# Patient Record
Sex: Female | Born: 1995 | Race: White | Hispanic: No | Marital: Single | Smoking: Never smoker
Health system: Southern US, Community
[De-identification: ages and names within clinical notes are randomized; demographics above are authoritative.]

## PROBLEM LIST (undated history)

## (undated) DIAGNOSIS — M358 Other specified systemic involvement of connective tissue: Secondary | ICD-10-CM

## (undated) DIAGNOSIS — J45909 Unspecified asthma, uncomplicated: Secondary | ICD-10-CM

## (undated) DIAGNOSIS — I7300 Raynaud's syndrome without gangrene: Secondary | ICD-10-CM

## (undated) DIAGNOSIS — K743 Primary biliary cirrhosis: Secondary | ICD-10-CM

## (undated) DIAGNOSIS — L940 Localized scleroderma [morphea]: Secondary | ICD-10-CM

## (undated) HISTORY — DX: Primary biliary cirrhosis: K74.3

## (undated) HISTORY — PX: HIP SURGERY: SHX245

## (undated) HISTORY — DX: Other specified systemic involvement of connective tissue: M35.8

## (undated) HISTORY — DX: Localized scleroderma (morphea): L94.0

---
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
## 2016-02-16 ENCOUNTER — Ambulatory Visit (INDEPENDENT_AMBULATORY_CARE_PROVIDER_SITE_OTHER): Payer: 59 | Admitting: Family Medicine

## 2016-02-16 ENCOUNTER — Encounter: Payer: Self-pay | Admitting: Family Medicine

## 2016-02-16 DIAGNOSIS — M76891 Other specified enthesopathies of right lower limb, excluding foot: Secondary | ICD-10-CM | POA: Diagnosis not present

## 2016-02-16 DIAGNOSIS — M25851 Other specified joint disorders, right hip: Secondary | ICD-10-CM

## 2016-02-16 NOTE — Progress Notes (Signed)
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 Patient states that over the summer she went to go see a hip surgeon. Since the patient had not done well with conservative nonoperative treatment during the spring she decided to have surgery. According to the op notes the labrum was intact and the patient had acetabuloplasty. She has a hip brace which she is supposed to wear for the next few weeks. She also has been told to see physical therapy twice weekly. She is having her first appointment with the physical therapist tomorrow. She denies having any significant pain at this time. She has another appointment with her surgeon in October. She denies any other symptoms at this time.  No exam performed today.  A/P: Right hip FAI, s/p acetabuloplasty - will write order for patient to see physical therapist twice weekly and follow protocol set by surgeon and PT at home, patient will also see Dr. Ardine Engiehl next week to make him aware of her case, advised patient to inform trainer if any increase in pain or other symptoms. Patient addresses understanding and is happy with plan.

## 2016-02-16 NOTE — Addendum Note (Signed)
Addended by: Dione HousekeeperPATEL, Janiyah Beery N on: 02/16/2016 11:13 AM   Modules accepted: Orders

## 2016-02-29 ENCOUNTER — Ambulatory Visit: Payer: 59 | Attending: Family Medicine | Admitting: Physical Therapy

## 2016-02-29 DIAGNOSIS — M25552 Pain in left hip: Secondary | ICD-10-CM | POA: Diagnosis present

## 2016-02-29 DIAGNOSIS — Z9889 Other specified postprocedural states: Secondary | ICD-10-CM | POA: Insufficient documentation

## 2016-02-29 DIAGNOSIS — R262 Difficulty in walking, not elsewhere classified: Secondary | ICD-10-CM | POA: Insufficient documentation

## 2016-02-29 DIAGNOSIS — M25551 Pain in right hip: Secondary | ICD-10-CM | POA: Insufficient documentation

## 2016-02-29 NOTE — Therapy (Signed)
[facility] PHYSICAL AND SPORTS MEDICINE [address] [place], [place], [address] Phone: [phone]   Fax:  [phone]  Physical Therapy Evaluation  Patient Details  Name: Meredith English MRN: [id number] Date of Birth: 1996-01-10 No Data Recorded  Encounter Date: 02/29/2016      PT End of Session - 02/29/16 1645    Visit Number 1   Number of Visits 13   Date for PT Re-Evaluation 05/23/16   PT Start Time 1345   PT Stop Time 1445   PT Time Calculation (min) 60 min   Activity Tolerance Patient tolerated treatment well   Behavior [facility] Therapy [facility] for tasks assessed/performed      No past medical history on file.  No past surgical history on file.  There were no vitals filed for this visit.       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 She was initially treated for a hip flexor strain. X-ray in March which was negative., MRI prior to returning home in April which showed nothing substantial. She tried PT and had an MRI arthrogram, was recommended PT vs cortisone vs sx. Opted for PT, though did not make substantial progress and opted for surgery.    Pertinent History FAI sx for R hip [place] January 05, 2016 in [place], began PT a week after sx and has been working with Su HoffBen Fisher x 1 per week at [employer] [facility].    Limitations Walking;Lifting;Sitting;Standing   How long can you sit comfortably? Patient reports anything longer than 45 minutes bothers it.    How long can you stand comfortably? Patient reports anything longer than 45 minutes bothers it.    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    Currently in Pain? No/denies  Only with pure hip  flexion motion.             [facility] PT Assessment - 02/29/16 1756      Assessment   Medical Diagnosis --  FAI surgery     Precautions   Precautions Anterior Hip   Precaution Comments --  See Surgeon's protocols   Required Braces or Orthoses --  Removed today     Restrictions   Weight Bearing Restrictions --  WBAT     Balance Screen   Has the patient fallen in the past 6 months No     Prior Function   Level of Independence Independent   Vocation Student   Leisure --  XC and Engineer, building servicesTrack athlete at [employer]     Cognition   Overall Cognitive Status Within Functional Limits for tasks assessed     Observation/Other Assessments-Edema    Edema --  None noted     Sensation   Light Touch Appears Intact       IR/ER on the R side painful (hip MMT) also on PROM strength mild deficit, but likely pain inhibited   No pain with knee extensor/flexor MMT -- all noted to be strong.   L knee to chest stretching by PT, felt stretch in anterior R thigh (felt good) -- felt relief with ambulation and increased ROM afterwards.   L hip  flexion stretching (IR/ER ROM WNL)   Sidelying clamshells with red t-band x 10 for 2 sets on R side only.    Assessed mini-squats , shifted weight to R otherwise appropriate technique.   No pain ascending/descending steps aside from hip flexion motion in anterior groin/thigh area though mild.   Single leg bridging on RLE (bilateral too easy) 2 sets x 5 repetitions with 5" holds (felt appropriately)   Gait- noted decreased DF on RLE.                      PT Education - 02/29/16 1749    Education provided Yes   Education Details Progression with therapy, avoid aggressive stretching into hip extension, protocol on her surgeon's website.    Person(s) Educated Patient   Methods Explanation;Demonstration;Handout   Comprehension Verbalized understanding;Returned demonstration             PT Long Term Goals - 02/29/16 1752      PT LONG  TERM GOAL #1   Title Patient will jog at least 1 mile with no increase in pain to demonstrate ability to tolerate athletic activities.      PT LONG TERM GOAL #2   Title Patient will perform full depth squat with 20# of external load with no increase in pain to demonstrate ability to tolerate recreational activities.    Time 6   Period Weeks   Status New     PT LONG TERM GOAL #3   Title Patient will perform single leg box jumps to at least 10" box with no valgus or pain noted to demonstrate appropriate power and strength of hip abductors for return to sport.    Period Weeks   Status New     PT LONG TERM GOAL #4   Title Patient will demonstrate at least 90% distance on triple hop, crossover test on RLE compared to LLE to demonstrate ability to return to sport.    Time 12   Period Weeks               Plan - 02/29/16 1645    Clinical Impression Statement Patient is roughly 8 weeks post-op FAI surgery on R hip after traumatic hip extension during a hurdles event. 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 She demonstrates Trendelenburg gait currently and reports pain with active hip flexion and passive hip extension, largely in her groin. She has been excellent with HEP and maintains active training in pool. Patient will benefit from skilled therapy services to address her deficits in strength, power, and ROM to address the deficits identified in this evaluation.    Rehab Potential Excellent   Clinical Impairments Affecting Rehab Potential Athletic background, rehab starting just after therapy    PT Frequency 1x / week   PT Duration 12 weeks   PT Treatment/Interventions ADLs/Self Care Home Management;Aquatic Therapy;Cryotherapy;Chief Operating Officer;Therapeutic exercise;Therapeutic activities;Manual techniques;Gait training;Stair training;Patient/family education   PT Next Visit Plan Progress through  protocol provided as tolerated    PT Home Exercise Plan Continue with her current HEP adding single leg bridging, sidelying clamshells, limiting hip flexor stretching.    Consulted and Agree with Plan of Care Patient      Patient will benefit from skilled therapeutic intervention in order to improve the following deficits and impairments:  Abnormal gait, Decreased endurance, Pain, Decreased strength, Difficulty walking, Decreased range of motion, Decreased mobility  Visit Diagnosis: Pain in  left hip  Difficulty in walking, not elsewhere classified  Status post hip surgery     Problem List There are no active problems to display for this patient.  Kerin Ransom, PT, DPT    02/29/2016, 6:05 PM   [facility] PHYSICAL AND SPORTS MEDICINE [address] [place], [address] Phone: [phone]   Fax:  [phone]  Name: Meredith English MRN: [id number] Date of Birth: Aug 13, 1995

## 2016-02-29 NOTE — Patient Instructions (Addendum)
IR/ER on the R side painful (hip MMT) also on PROM strength mild deficit, but likely pain inhibited   No pain with knee extensor/flexor MMT -- all noted to be strong.   L knee to chest stretching by PT, felt stretch in anterior R thigh (felt good) -- felt relief with ambulation and increased ROM afterwards.   L hip flexion stretching (IR/ER ROM WNL)   Sidelying clamshells with red t-band x 10 for 2 sets on R side only.    Assessed mini-squats , shifted weight to R otherwise appropriate technique.   No pain ascending/descending steps aside from hip flexion motion in anterior groin/thigh area though mild.   Single leg bridging on RLE (bilateral too easy) 2 sets x 5 repetitions with 5" holds (felt appropriately)   Gait- noted decreased DF on RLE.

## 2016-03-07 ENCOUNTER — Ambulatory Visit: Payer: 59 | Admitting: Physical Therapy

## 2016-03-07 DIAGNOSIS — M25552 Pain in left hip: Secondary | ICD-10-CM | POA: Diagnosis not present

## 2016-03-07 DIAGNOSIS — R262 Difficulty in walking, not elsewhere classified: Secondary | ICD-10-CM

## 2016-03-07 DIAGNOSIS — M25551 Pain in right hip: Secondary | ICD-10-CM

## 2016-03-07 DIAGNOSIS — Z9889 Other specified postprocedural states: Secondary | ICD-10-CM

## 2016-03-07 NOTE — Patient Instructions (Addendum)
Planks x 5 for 15"  Side planks x 5 for 15" holds (5# weight when LLE down)  Single leg bridging on RLE   Leg Press at 55# for 12, 65# for 12, x10 repetitions   Squat --   Gait assessed --   Standing hip abductions x 15 repetition for 2 sets (felt fatigued afterwards)    Bosu and with squats   Star Drill on Blue foam   (half kneeling single arm/double arm press on blue foam pad)

## 2016-03-08 NOTE — Therapy (Signed)
[place] [facility]ORTS MEDICINE [address] [place], [place], [address] Phone: [phone]   Fax:  [phone]  Physical Therapy Treatment  Patient Details  Name: Meredith English MRN: [id number] Date of Birth: 1995-07-17 No Data Recorded  Encounter Date: 03/07/2016      PT End of Session - 03/07/16 1837    Visit Number 2   Number of Visits 25   Date for PT Re-Evaluation 05/23/16   PT Start Time 1750   PT Stop Time 1835   PT Time Calculation (min) 45 min   Activity Tolerance Patient tolerated treatment well   Behavior During Therapy [facility] for tasks assessed/performed      No past medical history on file.  No past surgical history on file.  There were no vitals filed for this visit.      Subjective Assessment - 03/07/16 1751    Subjective Patient reports she will be transitioning to 2x per week here at [facility]. Patient reporting no pain recently, weak but [facility] with no pain, reporting less pain with hip flexion activity.    Pertinent History FAI sx for R hip on January 05, 2016 in [place], began PT a week after sx and has been working with Su HoffBen Fisher x 1 per week at [employer].    Limitations Walking;Lifting;Sitting;Standing   How long can you sit comfortably? Patient reports anything longer than 45 minutes bothers it.    How long can you stand comfortably? Patient reports anything longer than 45 minutes bothers it.    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    Currently in Pain? No/denies      Planks x 5 for 15" (challenging, required cuing for appropriate alignment of lumbo-pelvic region)   Side planks x 5 for 15" holds (5# weight when LLE down -- for rotations) -- challenging but no pain reported.   Single leg bridging on RLE able to hold and fatigue in posterior hip  musculature appropriately.   Leg Press at 55# for 12, 65# for 12, x10 repetitions   Squat -- able to get to roughly 50-60 degrees knee flexion secondary to pain/pinching sensation in anterior thigh.   Gait assessed -- continue to note mild to moderate IR of RLE during gait relative to LLE  Standing hip abductions x 15 repetition for 2 sets (felt fatigued afterwards)   Bosu squats with catching progressed to lateral step downs (appeared too challenging thus dc'd after 5 repetitions).   Star Drill on Blue foam x 5 rounds x 2 (second round with dual task of catching a ball)  (half kneeling single arm/double arm press on blue foam pad) x 5 per condition with 5#, 6# DBs for first then second sets bilaterally (no pain reported).                             PT Education - 03/07/16 1835    Education provided Yes   Education Details Will progress with more challenging exercises including deeper ROM squats, planks, side planks.    Person(s) Educated Patient   Methods Explanation;Demonstration   Comprehension Verbalized understanding;Returned demonstration             PT Long Term Goals - 02/29/16 1752  PT LONG TERM GOAL #1   Title Patient will jog at least 1 mile with no increase in pain to demonstrate ability to tolerate athletic activities.      PT LONG TERM GOAL #2   Title Patient will perform full depth squat with 20# of external load with no increase in pain to demonstrate ability to tolerate recreational activities.    Time 6   Period Weeks   Status New     PT LONG TERM GOAL #3   Title Patient will perform single leg box jumps to at least 10" box with no valgus or pain noted to demonstrate appropriate power and strength of hip abductors for return to sport.    Period Weeks   Status New     PT LONG TERM GOAL #4   Title Patient will demonstrate at least 90% distance on triple hop, crossover test on RLE compared to LLE to demonstrate ability to  return to sport.    Time 12   Period Weeks               Plan - 03/07/16 1838    Clinical Impression Statement Patient continues to demonstrate IR during gait on RLE, consistent with hip abductor/ER weakness, she fatigued quickly throughout session with more advanced exercise progressions. She reported mild discomfort at the conclusion of the session, educated to stretch and ice this evening and monitor symptoms over 24 hours. She is progressing well and demonstrating appropriate progress with therapy to date, though she will require additional skilled PT services to return to sporting activities.    Rehab Potential Excellent   Clinical Impairments Affecting Rehab Potential Athletic background, rehab starting just after therapy    PT Frequency 2x / week   PT Duration 12 weeks   PT Treatment/Interventions ADLs/Self Care Home Management;Aquatic Therapy;Cryotherapy;Chief Operating Officer;Therapeutic exercise;Therapeutic activities;Manual techniques;Gait training;Stair training;Patient/family education   PT Next Visit Plan Progress through protocol provided as tolerated    PT Home Exercise Plan Continue with her current HEP adding single leg bridging, sidelying clamshells, limiting hip flexor stretching.    Consulted and Agree with Plan of Care Patient      Patient will benefit from skilled therapeutic intervention in order to improve the following deficits and impairments:  Abnormal gait, Decreased endurance, Pain, Decreased strength, Difficulty walking, Decreased range of motion, Decreased mobility  Visit Diagnosis: Difficulty in walking, not elsewhere classified  Status post hip surgery  Pain in right hip     Problem List There are no active problems to display for this patient.  Kerin Ransom, PT, DPT    03/08/2016, 10:43 AM  [place] [facility] PHYSICAL AND SPORTS MEDICINE [address], [place],  [address] Phone: [phone]   Fax:  [phone]  Name: Cheresa Siers MRN: [id number] Date of Birth: January 01, 1996

## 2016-03-10 ENCOUNTER — Ambulatory Visit: Payer: 59 | Admitting: Physical Therapy

## 2016-03-10 DIAGNOSIS — M25552 Pain in left hip: Secondary | ICD-10-CM | POA: Diagnosis not present

## 2016-03-10 DIAGNOSIS — M25551 Pain in right hip: Secondary | ICD-10-CM

## 2016-03-10 DIAGNOSIS — Z9889 Other specified postprocedural states: Secondary | ICD-10-CM

## 2016-03-10 DIAGNOSIS — R262 Difficulty in walking, not elsewhere classified: Secondary | ICD-10-CM

## 2016-03-10 NOTE — Patient Instructions (Addendum)
Standing hip abductions x 12 per side for 2 sets (tiring)   Palloff  Press with 10#  TG Squats to 90 degrees at level 18 x 12, at level 22 x 12 repetitions (no pinching) -- at level 24 x 10 (no pinching) -- Single leg x 12 at level 22 (2 sets)   Stir the pot on the grey ball   Lateral side to side

## 2016-03-14 NOTE — Therapy (Signed)
[facility] PH[facility]ORTS MEDICINE [address] [place], [address] Phone: [phone]   Fax:  [phone]  Physical Therapy Treatment  Patient Details  Name: Meredith English MRN: [id number] Date of Birth: 1996-05-18 No Data Recorded  Encounter Date: 03/10/2016      PT End of Session - 03/14/16 0934    Visit Number 3   Number of Visits 25   Date for PT Re-Evaluation 05/23/16   PT Start Time 1650   PT Stop Time 1730   PT Time Calculation (min) 40 min   Activity Tolerance Patient tolerated treatment well   Behavior During Therapy [facility] for tasks assessed/performed      No past medical history on file.  No past surgical history on file.  There were no vitals filed for this visit.      Subjective Assessment - 03/14/16 0937    Subjective Patient reports she felt some soreness after her last session, which dissipated after stretching and by this session.    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    Limitations Walking;Lifting;Sitting;Standing   How long can you sit comfortably? Patient reports anything longer than 45 minutes bothers it.    How long can you stand comfortably? Patient reports anything longer than 45 minutes bothers it.    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    Currently in Pain? No/denies      Standing hip abductions x 12 per side for 2 sets (tiring)   Palloff  Press with 10# (2 sets x 10 repetitions bilaterally)   TG Squats to 90 degrees at level 18 x 12, at level 22 x 12 repetitions (no pinching) -- at level 24 x 10 (no pinching) -- Single leg x 12 at level 22 (2 sets)   Stir the pot on the grey ball 2 sets x 8 repetitions per side    Lateral side to side resisted band walking x 8  bilaterally with yellow t-band x 2 sets   ** Patient reported fatigue, not pain after this. Performed half kneeling hip flexor stretch for pain control.                             PT Education - 03/14/16 0933    Education provided Yes   Education Details Organized HEP to reduce demand of time.    Person(s) Educated Patient   Methods Explanation;Demonstration   Comprehension Verbalized understanding;Returned demonstration             PT Long Term Goals - 02/29/16 1752      PT LONG TERM GOAL #1   Title Patient will jog at least 1 mile with no increase in pain to demonstrate ability to tolerate athletic activities.      PT LONG TERM GOAL #2   Title Patient will perform full depth squat with 20# of external load with no increase in pain to demonstrate ability to tolerate recreational activities.    Time 6   Period Weeks   Status New     PT LONG TERM GOAL #3   Title Patient will perform single leg box jumps to  at least 10" box with no valgus or pain noted to demonstrate appropriate power and strength of hip abductors for return to sport.    Period Weeks   Status New     PT LONG TERM GOAL #4   Title Patient will demonstrate at least 90% distance on triple hop, crossover test on RLE compared to LLE to demonstrate ability to return to sport.    Time 12   Period Weeks               Plan - 03/14/16 0935    Clinical Impression Statement Patient demonstrating improved tolerance for strengthening activities, though she fatigues throughout session. She can tolerate increased core progressions, though fatigues with repetitions. She is reporting less pain in her anterior hip, denies any pain with squatting on total gym in this session. Patient will continue to benefit from skilled PT services to address her decreased strength and endurance.    Rehab Potential Excellent   Clinical Impairments Affecting Rehab Potential Athletic background, rehab starting just  after therapy    PT Frequency 2x / week   PT Duration 12 weeks   PT Treatment/Interventions ADLs/Self Care Home Management;Aquatic Therapy;Cryotherapy;Chief Operating Officerlectrical Stimulation;Traction;Balance training;Therapeutic exercise;Therapeutic activities;Manual techniques;Gait training;Stair training;Patient/family education   PT Next Visit Plan Progress through protocol provided as tolerated    PT Home Exercise Plan Continue with her current HEP adding single leg bridging, sidelying clamshells, limiting hip flexor stretching.    Consulted and Agree with Plan of Care Patient      Patient will benefit from skilled therapeutic intervention in order to improve the following deficits and impairments:  Abnormal gait, Decreased endurance, Pain, Decreased strength, Difficulty walking, Decreased range of motion, Decreased mobility  Visit Diagnosis: Difficulty in walking, not elsewhere classified  Status post hip surgery  Pain in right hip     Problem List There are no active problems to display for this patient.  Kerin RansomPatrick A McNamara, PT, DPT    03/14/2016, 9:47 AM  [facility]ICINE [address] [place], [place], [address] Phone: [phone]   Fax:  [phone]  Name: Meredith English MRN: [id number] Date of Birth: May 18, 1996

## 2016-03-15 ENCOUNTER — Ambulatory Visit: Payer: 59 | Admitting: Physical Therapy

## 2016-03-15 DIAGNOSIS — M25552 Pain in left hip: Secondary | ICD-10-CM | POA: Diagnosis not present

## 2016-03-15 DIAGNOSIS — R262 Difficulty in walking, not elsewhere classified: Secondary | ICD-10-CM

## 2016-03-15 DIAGNOSIS — M25551 Pain in right hip: Secondary | ICD-10-CM

## 2016-03-15 DIAGNOSIS — Z9889 Other specified postprocedural states: Secondary | ICD-10-CM

## 2016-03-15 NOTE — Therapy (Signed)
[place] [facility] PHYSICAL AND SPORTS MEDICINE [address] [place], [place], [address] Phone: [phone]   Fax:  [phone]  Physical Therapy Treatment  Patient Details  Name: Meredith English MRN: [id number] Date of Birth: 03/20/1996 No Data Recorded  Encounter Date: 03/15/2016      PT End of Session - 03/15/16 0933    Visit Number 4   Number of Visits 25   Date for PT Re-Evaluation 05/23/16   PT Start Time 0845   PT Stop Time 0923   PT Time Calculation (min) 38 min   Activity Tolerance Patient tolerated treatment well   Behavior [facility] assessed/performed      No past medical history on file.  No past surgical history on file.  There were no vitals filed for this visit.      Subjective Assessment - 03/15/16 0849    Subjective Patient reports she was sore after last PT session, but has felt great all weekend (didn't feel like she needed to ice).    Pertinent History FAI sx for R hip on January 05, 2016 in [place], began PT a week after sx and has been working with Su HoffBen Fisher x 1 per week at [employer].    Limitations Walking;Lifting;Sitting;Standing   How long can you sit comfortably? Patient reports anything longer than 45 minutes bothers it.    How long can you stand comfortably? Patient reports anything longer than 45 minutes bothers it.    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    Currently in Pain? No/denies      1/2 kneeling hip flexor stretch 2 per side  Side stepping with red t-band x 18 per side for 2 sets (fatiguing for her in posterior hip)   Lunges x 20, x 8 per side with 3# DB in waiter's position (challenging but no pain) -- progressed to suitcase posiiton with 5# DB x 10 per side (no pain)   Leg Press -- 75# x 8 repetitions,  85# x 8 repetitions for 2 sets  Planks with 1 arm up to  contralateral arm to LEs 1 at a time x 10 seconds for 60" total. (most challenging)   5" holds on plank with LLE elevated x 5 repetitions with breaks in between.                            PT Education - 03/15/16 0932    Education provided Yes   Education Details Added squats with band and plank with leg lift to HEP.    Person(s) Educated Patient   Methods Explanation;Demonstration;Handout   Comprehension Verbalized understanding;Returned demonstration             PT Long Term Goals - 02/29/16 1752      PT LONG TERM GOAL #1   Title Patient will jog at least 1 mile with no increase in pain to demonstrate ability to tolerate athletic activities.      PT LONG TERM GOAL #2   Title Patient will perform full depth squat with 20# of external load with no increase in pain to demonstrate ability to tolerate recreational activities.    Time 6   Period Weeks   Status New     PT LONG TERM GOAL #3   Title Patient  will perform single leg box jumps to at least 10" box with no valgus or pain noted to demonstrate appropriate power and strength of hip abductors for return to sport.    Period Weeks   Status New     PT LONG TERM GOAL #4   Title Patient will demonstrate at least 90% distance on triple hop, crossover test on RLE compared to LLE to demonstrate ability to return to sport.    Time 12   Period Weeks               Plan - 03/15/16 6045    Clinical Impression Statement Patient reports significant improvement in symptoms over the weekend with no pain or need for icing. She continues to demonstrate weakness/fatigue with prolonged resistance training/core training. She struggles with LLE elevation during planks and shifts weight to the R during squats, demonstrates improved glute med strength via side stepping.    Rehab Potential Excellent   Clinical Impairments Affecting Rehab Potential Athletic background, rehab starting just after therapy    PT Frequency  2x / week   PT Duration 12 weeks   PT Treatment/Interventions ADLs/Self Care Home Management;Aquatic Therapy;Cryotherapy;Chief Operating Officer;Therapeutic exercise;Therapeutic activities;Manual techniques;Gait training;Stair training;Patient/family education   PT Next Visit Plan Progress through protocol provided as tolerated    PT Home Exercise Plan Continue with her current HEP adding single leg bridging, sidelying clamshells, limiting hip flexor stretching.    Consulted and Agree with Plan of Care Patient      Patient will benefit from skilled therapeutic intervention in order to improve the following deficits and impairments:  Abnormal gait, Decreased endurance, Pain, Decreased strength, Difficulty walking, Decreased range of motion, Decreased mobility  Visit Diagnosis: Difficulty in walking, not elsewhere classified  Status post hip surgery  Pain in right hip     Problem List There are no active problems to display for this patient.  Kerin Ransom, PT, DPT    03/15/2016, 9:38 AM  [place] [facility] PHYSICAL AND SPORTS MEDICINE [address], [place], [address] Phone: [phone]   Fax:  [phone]  Name: Meredith English MRN: [id number] Date of Birth: Feb 05, 1996

## 2016-03-15 NOTE — Patient Instructions (Addendum)
1/2 kneeling hip flexor stretch 2 per side  Side stepping with red t-band x 18 per side for 2 sets (fatiguing for her in posterior hip)   Lunges x 20, x 8 per side with 3# DB in waiter's position (challenging but no pain) -- progressed to suitcase posiiton with 5# DB x 10 per side (no pain)   Leg Press -- 75# x 8 repetitions,  85# x 8 repetitions for 2 sets  Planks with 1 arm up to contralateral arm to LEs 1 at a time x 10 seconds for 60" total. (most challenging)   5" holds on plank with LLE elevated x 5 repetitions with breaks in between.

## 2016-03-17 ENCOUNTER — Ambulatory Visit: Payer: 59 | Admitting: Physical Therapy

## 2016-03-17 DIAGNOSIS — M25552 Pain in left hip: Secondary | ICD-10-CM | POA: Diagnosis not present

## 2016-03-17 DIAGNOSIS — M25551 Pain in right hip: Secondary | ICD-10-CM

## 2016-03-17 DIAGNOSIS — Z9889 Other specified postprocedural states: Secondary | ICD-10-CM

## 2016-03-17 DIAGNOSIS — R262 Difficulty in walking, not elsewhere classified: Secondary | ICD-10-CM

## 2016-03-17 NOTE — Therapy (Signed)
[place] [facility] PH[facility]ORTS MEDICINE [address] [place], [address] Phone: [phone]   Fax:  [phone]  Physical Therapy Treatment  Patient Details  Name: Meredith English MRN: [id number] Date of Birth: 18-May-1996 No Data Recorded  Encounter Date: 03/17/2016      PT End of Session - 03/17/16 1019    Visit Number 5   Number of Visits 25   Date for PT Re-Evaluation 05/23/16   PT Start Time 0932   PT Stop Time 1015   PT Time Calculation (min) 43 min   Activity Tolerance Patient tolerated treatment well   Behavior During Therapy [facility] for tasks assessed/performed      No past medical history on file.  No past surgical history on file.  There were no vitals filed for this visit.      Subjective Assessment - 03/17/16 0934    Subjective Patient reports she saw Dr. Sherrie Mustache at [facility] yesterday, no progressions made as she was coming to therapy today as well. Reports feeling pretty good today.    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    Limitations Walking;Lifting;Sitting;Standing   How long can you sit comfortably? Patient reports anything longer than 45 minutes bothers it.    How long can you stand comfortably? Patient reports anything longer than 45 minutes bothers it.    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    Currently in Pain? No/denies     Bil hip flexor stretch, 2x30 sec each  Bil SLS on airex with ball toss, on bosu with external perturbations with PVC pipe, on airex with EC and external perturbations with PVC pipe x 15 mins total  Bil fwd lunging with FFE on BOSU, 2x15 each; increased difficulty with RLE fwd  Lunge with alternating OH suitcase carry with 7# DB, 5ft x 5 laps; pt reported as  challenging  Bil SLS on airex and lifts with 10# x 10 each, 15# x 10 each  bil SLS on BOSU with ball toss into trampoline, 3x10 each; increased difficulty on LLE  bil trunk rotations with 2 grey bands (bil stance) x 15 each side; progressed to bil half kneeling with trunk rotations with 2 grey bands x 15 each; pt felt slight increased pain in R hip when performing with LLE down on airex pad which subsided after exercise      PT Education - 03/17/16 1018    Education provided Yes   Education Details continue HEP and stretching, will update HEP next week   Person(s) Educated Patient   Methods Explanation   Comprehension Verbalized understanding             PT Long Term Goals - 02/29/16 1752      PT LONG TERM GOAL #1   Title Patient will jog at least 1 mile with no increase in pain to demonstrate ability to tolerate athletic activities.      PT LONG TERM GOAL #2   Title Patient will perform full depth squat with 20# of external load with no increase in pain to demonstrate ability to tolerate recreational activities.    Time 6   Period Weeks   Status New  PT LONG TERM GOAL #3   Title Patient will perform single leg box jumps to at least 10" box with no valgus or pain noted to demonstrate appropriate power and strength of hip abductors for return to sport.    Period Weeks   Status New     PT LONG TERM GOAL #4   Title Patient will demonstrate at least 90% distance on triple hop, crossover test on RLE compared to LLE to demonstrate ability to return to sport.    Time 12   Period Weeks               Plan - 03/17/16 1019    Clinical Impression Statement Pt tolerated core strengthening and proprioception training well this date, with no increases in R hip pain. Pt had increased difficulty with balance and proprioception training on LLE, but did c/o RLE fatigue at end of session this date. Pt educated to continue HEP.   Rehab Potential Excellent   Clinical Impairments  Affecting Rehab Potential Athletic background, rehab starting just after therapy    PT Frequency 2x / week   PT Duration 12 weeks   PT Treatment/Interventions ADLs/Self Care Home Management;Aquatic Therapy;Cryotherapy;Chief Operating Officerlectrical Stimulation;Traction;Balance training;Therapeutic exercise;Therapeutic activities;Manual techniques;Gait training;Stair training;Patient/family education   PT Next Visit Plan Progress through protocol provided as tolerated    PT Home Exercise Plan Continue with her current HEP adding single leg bridging, sidelying clamshells, limiting hip flexor stretching.    Consulted and Agree with Plan of Care Patient      Patient will benefit from skilled therapeutic intervention in order to improve the following deficits and impairments:  Abnormal gait, Decreased endurance, Pain, Decreased strength, Difficulty walking, Decreased range of motion, Decreased mobility  Visit Diagnosis: Difficulty in walking, not elsewhere classified  Status post hip surgery  Pain in right hip  Pain in left hip     Problem List There are no active problems to display for this patient.  Jac CanavanBrooke Arleth Mccullar, SPT  Jac CanavanBrooke Vivienne Sangiovanni 03/17/2016, 10:23 AM  [place] [facility] [address] [place], [place], [address] Phone: [phone]   Fax:  [phone]  Name: Evelina DunGrace Chevere MRN: [id number] Date of Birth: 1995/08/05

## 2016-03-17 NOTE — Patient Instructions (Signed)
Half kneeling hip flexor stretch x 5 per side on blue foam pad   Single leg stance catching ball on blue foam pad   Single leg stance on BOSU with forward/lateral perturbations with eyes open (on blue side of BOSU) with PVC pipe

## 2016-03-22 ENCOUNTER — Ambulatory Visit: Payer: 59 | Attending: Family Medicine

## 2016-03-22 ENCOUNTER — Encounter: Payer: Self-pay | Admitting: Physical Therapy

## 2016-03-22 DIAGNOSIS — M25551 Pain in right hip: Secondary | ICD-10-CM | POA: Insufficient documentation

## 2016-03-22 DIAGNOSIS — R262 Difficulty in walking, not elsewhere classified: Secondary | ICD-10-CM | POA: Diagnosis present

## 2016-03-22 DIAGNOSIS — Z9889 Other specified postprocedural states: Secondary | ICD-10-CM | POA: Insufficient documentation

## 2016-03-22 NOTE — Therapy (Signed)
[facility] [facility] PH[facility]INE [address] [place], [place], [address] Phone: [phone]   Fax:  [phone]  Physical Therapy Treatment  Patient Details  Name: Meredith English MRN: [id number] Date of Birth: 06/12/1996 No Data Recorded  Encounter Date: 03/22/2016      PT End of Session - 03/22/16 0910    Visit Number 6   Number of Visits 25   Date for PT Re-Evaluation 05/23/16   PT Start Time 0902   PT Stop Time 0945   PT Time Calculation (min) 43 min   Activity Tolerance Patient tolerated treatment well   Behavior During Therapy [facility] for tasks assessed/performed      History reviewed. No pertinent past medical history.  History reviewed. No pertinent surgical history.  There were no vitals filed for this visit.      Subjective Assessment - 03/22/16 0906    Subjective Pt states she is doing well at this time. Denies pain and reports return to normal function currently. She reports intermittent stiffness in her R hip. No specific questions or concerns currently. HEP is going well.    Pertinent History FAI sx for R hip on January 05, 2016 in [place], began PT a week after sx and has been working with Su HoffBen Fisher x 1 per week at [employer].    Limitations Walking;Lifting;Sitting;Standing   How long can you sit comfortably? Patient reports anything longer than 45 minutes bothers it.    How long can you stand comfortably? Patient reports anything longer than 45 minutes bothers it.    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    Currently in Pain? No/denies      TREATMENT  THER-EX Bil hip flexor stretch in kneeling on Airex pad, 3 x 30 sec each Forward BOSU lunges alternating LE forward x 10 each; Side BOSU lunges x 10 each; Forward BOSU lunges alternating LE forward with trunk rotation toward leading leg x 10 each; Single  leg wall ball squats 2 x 10 bilateral; Lunge with alternating OH suitcase carry with 7# DB, 3520ft x 6 laps; pt reported as moderate to moderate/max challenging Bilateral SLS on BOSU with ball toss 2 x 30 seconds on each leg; Single leg deadlift on Airex pad, no weight, limiting forward lean to 90 degrees hip flexion 2 x 10 bilateral; Prone R hip IR/ER stretches 30 seconds x 2 each; Prone R quadriceps stretch 30 seconds x 2;                        PT Education - 03/22/16 0910    Education provided Yes   Education Details Continue HEP and stretching   Person(s) Educated Patient   Methods Explanation   Comprehension Verbalized understanding             PT Long Term Goals - 02/29/16 1752      PT LONG TERM GOAL #1   Title Patient will jog at least 1 mile with no increase in pain to demonstrate ability to tolerate athletic activities.      PT LONG TERM GOAL #2   Title Patient will perform full depth squat with 20# of external load with no increase in pain to demonstrate ability to tolerate recreational activities.    Time 6   Period Weeks   Status  New     PT LONG TERM GOAL #3   Title Patient will perform single leg box jumps to at least 10" box with no valgus or pain noted to demonstrate appropriate power and strength of hip abductors for return to sport.    Period Weeks   Status New     PT LONG TERM GOAL #4   Title Patient will demonstrate at least 90% distance on triple hop, crossover test on RLE compared to LLE to demonstrate ability to return to sport.    Time 12   Period Weeks               Plan - 03/22/16 1335    Clinical Impression Statement Pt denies any hip pain during PT session today. However she does report RLE fatigue with exercises. She reports slight increase in R knee valgus/R hip IR during single leg wall squats. Pt also demonstrates some R hip instability during forward lunges with trunk rotation when leading with RLE. Pt encouraged  to continue HEP and follow-up as scheduled.    Rehab Potential Excellent   Clinical Impairments Affecting Rehab Potential Athletic background, rehab starting just after therapy    PT Frequency 2x / week   PT Duration 12 weeks   PT Treatment/Interventions ADLs/Self Care Home Management;Aquatic Therapy;Cryotherapy;Chief Operating Officer;Therapeutic exercise;Therapeutic activities;Manual techniques;Gait training;Stair training;Patient/family education   PT Next Visit Plan Progress through protocol provided as tolerated    PT Home Exercise Plan Continue with her current HEP adding single leg bridging, sidelying clamshells, limiting hip flexor stretching.    Consulted and Agree with Plan of Care Patient      Patient will benefit from skilled therapeutic intervention in order to improve the following deficits and impairments:  Abnormal gait, Decreased endurance, Pain, Decreased strength, Difficulty walking, Decreased range of motion, Decreased mobility  Visit Diagnosis: Difficulty in walking, not elsewhere classified     Problem List There are no active problems to display for this patient.  Lynnea Maizes PT, DPT   Loreda Silverio 03/22/2016, 1:46 PM  [facility] PHYSICAL AND SPORTS MEDICINE [address] [place], [address] Phone: [phone]   Fax:  [phone]  Name: Meredith English MRN: [id number] Date of Birth: 03-24-1996

## 2016-03-24 ENCOUNTER — Ambulatory Visit: Payer: 59

## 2016-03-24 DIAGNOSIS — M25551 Pain in right hip: Secondary | ICD-10-CM

## 2016-03-24 DIAGNOSIS — R262 Difficulty in walking, not elsewhere classified: Secondary | ICD-10-CM

## 2016-03-24 NOTE — Therapy (Signed)
[facility] PH[facility]ICINE [address] [place], [place], [address] Phone: [phone]   Fax:  [phone]  Physical Therapy Treatment  Patient Details  Name: Meredith English MRN: [id number] Date of Birth: [birth year] No Data Recorded  Encounter Date: 03/24/2016      PT End of Session - 03/24/16 1452    Visit Number 7   Number of Visits 25   Date for PT Re-Evaluation 05/23/16   PT Start Time 0800   PT Stop Time 0845   PT Time Calculation (min) 45 min   Activity Tolerance Patient tolerated treatment well   Behavior During Therapy [facility] for tasks assessed/performed      History reviewed. No pertinent past medical history.  History reviewed. No pertinent surgical history.  There were no vitals filed for this visit.      Subjective Assessment - 03/24/16 1449    Subjective Pt reports she is doing well currently. She denies any increase in pain or soreness after her last therapy session but reports that her muscles were tired. Pt reports that she felt like her knee was more mobile after last session following hip stretches. No specific questions or concerns at this time.    Pertinent History FAI sx for R hip on January 05, 2016 in [place], began PT a week after sx and has been working with Su HoffBen Fisher x 1 per week at [employer].    Limitations Walking;Lifting;Sitting;Standing   How long can you sit comfortably? Patient reports anything longer than 45 minutes bothers it.    How long can you stand comfortably? Patient reports anything longer than 45 minutes bothers it.    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    Currently in Pain? No/denies      THER-EX Elliptical x 5 minutes for warm-up during history (3 minutes unbilled); Prone R hip IR/ER stretches 30 seconds x 2 each; Prone R quadriceps stretch 30 seconds x 2; Bil hip  flexor stretch in kneeling on Airex pad, 3 x 30 sec each Inline half-kneeling pallof press with green tband alternating LE 2 x 10 each; Forward BOSU lunges with opposite UE overhead suitcase carry 7# x 10 each; Side BOSU lunges with 5# dumbell in hands 2 x 10 each direction; TRX single leg squats 2 x 10 bilateral; SLS on BOSU with 1kg ball toss 2 x 30 seconds on each leg; Side bridging with upper leg abduction lift at top x 10 bilateral;                            PT Education - 03/24/16 1452    Education provided Yes   Education Details Continue HEP and stretching   Person(s) Educated Patient   Methods Explanation   Comprehension Verbalized understanding             PT Long Term Goals - 02/29/16 1752      PT LONG TERM GOAL #1   Title Patient will jog at least 1 mile with no increase in pain to demonstrate ability to tolerate athletic activities.      PT LONG TERM GOAL #2   Title Patient will perform full depth squat with 20# of external load with no increase in pain to demonstrate ability to tolerate recreational activities.  Time 6   Period Weeks   Status New     PT LONG TERM GOAL #3   Title Patient will perform single leg box jumps to at least 10" box with no valgus or pain noted to demonstrate appropriate power and strength of hip abductors for return to sport.    Period Weeks   Status New     PT LONG TERM GOAL #4   Title Patient will demonstrate at least 90% distance on triple hop, crossover test on RLE compared to LLE to demonstrate ability to return to sport.    Time 12   Period Weeks               Plan - 03/24/16 1452    Clinical Impression Statement Pt continues to deny any hip pain prior to or during PT session today. She does report RLE fatigue as well as decreased stability especially with lunging on BOSU. Pt demonstrates good progress with her RLE strength and stability. Encouraged pt to continue on the elliptical, in the  pool, and with her HEP. Follow-up as scheduled.    Rehab Potential Excellent   Clinical Impairments Affecting Rehab Potential Athletic background, rehab starting just after therapy    PT Frequency 2x / week   PT Duration 12 weeks   PT Treatment/Interventions ADLs/Self Care Home Management;Aquatic Therapy;Cryotherapy;Chief Operating Officer;Therapeutic exercise;Therapeutic activities;Manual techniques;Gait training;Stair training;Patient/family education   PT Next Visit Plan Progress through protocol provided as tolerated    PT Home Exercise Plan Continue with her current HEP adding single leg bridging, sidelying clamshells, limiting hip flexor stretching.    Consulted and Agree with Plan of Care Patient      Patient will benefit from skilled therapeutic intervention in order to improve the following deficits and impairments:  Abnormal gait, Decreased endurance, Pain, Decreased strength, Difficulty walking, Decreased range of motion, Decreased mobility  Visit Diagnosis: Difficulty in walking, not elsewhere classified  Pain in right hip     Problem List There are no active problems to display for this patient.  Lynnea Maizes PT, DPT   Huprich,Jason 03/24/2016, 2:58 PM  [place] [facility] PHYSICAL AND SPORTS MEDICINE [address] [place], [address] Phone: [phone]   Fax:  [phone]  Name: Meredith English MRN: [id number] Date of Birth: [birth year]

## 2016-03-29 ENCOUNTER — Ambulatory Visit: Payer: 59 | Admitting: Physical Therapy

## 2016-03-29 DIAGNOSIS — Z9889 Other specified postprocedural states: Secondary | ICD-10-CM

## 2016-03-29 DIAGNOSIS — R262 Difficulty in walking, not elsewhere classified: Secondary | ICD-10-CM | POA: Diagnosis not present

## 2016-03-29 DIAGNOSIS — M25551 Pain in right hip: Secondary | ICD-10-CM

## 2016-03-29 NOTE — Therapy (Signed)
[facility] [address], [place], [address] Phone: [phone]   Fax:  [phone]  Physical Therapy Treatment  Patient Details  Name: Meredith English MRN: [id number] Date of Birth: Apr 20, 1996 No Data Recorded  Encounter Date: 03/29/2016      PT End of Session - 03/29/16 0951    Visit Number 8   Number of Visits 25   Date for PT Re-Evaluation 05/23/16   PT Start Time 0905   PT Stop Time 0945   PT Time Calculation (min) 40 min   Activity Tolerance Patient tolerated treatment well   Behavior During Therapy [facility] for tasks assessed/performed      No past medical history on file.  No past surgical history on file.  There were no vitals filed for this visit.      Subjective Assessment - 03/29/16 0908    Subjective Patient reports feeling limited/stiff in ROM. She has been compliant with HEP, reports no increased pain.    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    Limitations Walking;Lifting;Sitting;Standing   How long can you sit comfortably? Patient reports anything longer than 45 minutes bothers it.    How long can you stand comfortably? Patient reports anything longer than 45 minutes bothers it.    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    Currently in Pain? No/denies     Hip flexor stretching in half kneeling 3 bouts x 20-30" bilaterally, which appeared to relieve stiffness she initially experienced.   2 feet in through ladder x 4  Forward, laterally, diagonally (no pain reported)   B Skip - 4 bouts through clinic, no pain reported.   Lateral shuffle (no pain) x 2 ( for 2 sets) -- felt more difficulty with pushing off her RLE.  A-skip - reported pain/discomfort with flexing  her R hip forward, present even at lower speeds. Attempted stretching hip flexor and re-attempted A-skip    Gait assessment (with jog) -- demonstrating appropriate knee flexion and heel strike, increased valgus on midstance on RLE relative to LLE. Reported no pain, however feeling of weakness on RLE.   Standing yellow t-band x15 repetitions with cuing to complete for 3 sets at high rate of speed on LLE moving only to promote improved RLE stance.                             PT Education - 03/29/16 0951    Education provided Yes   Education Details Begin very gentle short jogging, A-skips in the pool.    Person(s) Educated Patient   Methods Explanation;Demonstration   Comprehension Verbalized understanding;Returned demonstration             PT Long Term Goals - 02/29/16 1752      PT LONG TERM GOAL #1   Title Patient will jog at least 1 mile with no increase in pain to demonstrate ability to tolerate athletic activities.      PT LONG TERM GOAL #2   Title Patient will perform full depth squat with 20# of external load with no increase in pain to demonstrate ability to tolerate  recreational activities.    Time 6   Period Weeks   Status New     PT LONG TERM GOAL #3   Title Patient will perform single leg box jumps to at least 10" box with no valgus or pain noted to demonstrate appropriate power and strength of hip abductors for return to sport.    Period Weeks   Status New     PT LONG TERM GOAL #4   Title Patient will demonstrate at least 90% distance on triple hop, crossover test on RLE compared to LLE to demonstrate ability to return to sport.    Time 12   Period Weeks               Plan - 03/29/16 [id number]    Clinical Impression Statement Patient demonstrating valgus/decreased control of R knee/thigh in faster paced motions today (jogging). Reports some of the pain/discomfort with light speed A-skips today, not B-skips though poor control on descent  noted (substituted with lumbar extension). Continues to have mild loss of neutral core with higher level glute med exercises such as bridging with hip abduction. She was able to begin initial ladder drills at low speed with no adverse reactions, noted a sense of heaviness pushing off RLE. Patient encouraged to begin light jogging on straight-aways.    Rehab Potential Excellent   Clinical Impairments Affecting Rehab Potential Athletic background, rehab starting just after therapy    PT Frequency 2x / week   PT Duration 12 weeks   PT Treatment/Interventions ADLs/Self Care Home Management;Aquatic Therapy;Cryotherapy;Chief Operating Officerlectrical Stimulation;Traction;Balance training;Therapeutic exercise;Therapeutic activities;Manual techniques;Gait training;Stair training;Patient/family education   PT Next Visit Plan Progress through protocol provided as tolerated    PT Home Exercise Plan Continue with her current HEP adding single leg bridging, sidelying clamshells, limiting hip flexor stretching.    Consulted and Agree with Plan of Care Patient      Patient will benefit from skilled therapeutic intervention in order to improve the following deficits and impairments:  Abnormal gait, Decreased endurance, Pain, Decreased strength, Difficulty walking, Decreased range of motion, Decreased mobility  Visit Diagnosis: Difficulty in walking, not elsewhere classified  Pain in right hip  Status post hip surgery     Problem List There are no active problems to display for this patient.  Kerin RansomPatrick A McNamara, PT, DPT    03/29/2016, 10:04 AM   [facility] [address] [place], [place], [address] Phone: [phone]   Fax:  [phone]  Name: Evelina DunGrace Impson MRN: [id number] Date of Birth: December 13, 1995

## 2016-03-29 NOTE — Patient Instructions (Signed)
Hip flexor stretching in half kneeling   2 feet in through ladder x 10    B Skip  Lateral shuffle (no pain) x 2 ( for 2 sets)   A-skip - reported pain/discomfort with flexing her R hip forward, present even at lower speeds. Attempted stretching hip flexor and re-attempted A-skip    Gait assessment (with jog)  Standing yellow t-band x15 repetitions with cuing to complete for 3 sets

## 2016-03-31 ENCOUNTER — Ambulatory Visit: Payer: 59 | Admitting: Physical Therapy

## 2016-03-31 DIAGNOSIS — M25551 Pain in right hip: Secondary | ICD-10-CM

## 2016-03-31 DIAGNOSIS — Z9889 Other specified postprocedural states: Secondary | ICD-10-CM

## 2016-03-31 DIAGNOSIS — R262 Difficulty in walking, not elsewhere classified: Secondary | ICD-10-CM

## 2016-03-31 NOTE — Patient Instructions (Addendum)
Squats with 5# DB x 10 -- some mild soreness in anterior hip/thigh reported.   Deadlifts with 20# x 10 for 2 sets (mild rounding in lumbar spine noted) -- reported feeling in HS and and gluteals primarily.   Half kneeling hip flexor stretch x 4 per side for 20" for decreased soreness.   Stir the pot x 12 per side on grey thera-ball   Half kneeling hip flexor stretch x 5 per side x 10-15" for pain relief (noted decreased pain/soreness after this)   Leg curls on OMEGA x 15 repetitions at 25# (easy) completed 35# x 15 repetitions

## 2016-04-04 NOTE — Therapy (Signed)
[facility] [address] [place], [address] Phone: [phone]   Fax:  [phone]  Physical Therapy Treatment  Patient Details  Name: Meredith English MRN: [id number] Date of Birth: 1995/09/03 No Data Recorded  Encounter Date: 03/31/2016      PT End of Session - 04/04/16 1032    Visit Number 9   Number of Visits 25   Date for PT Re-Evaluation 05/23/16   PT Start Time 1755   PT Stop Time 1830   PT Time Calculation (min) 35 min   Activity Tolerance Patient tolerated treatment well   Behavior During Therapy [facility] for tasks assessed/performed      No past medical history on file.  No past surgical history on file.  There were no vitals filed for this visit.      Subjective Assessment - 04/04/16 1030    Subjective Patient reports feeling some soreness in anterior R hip after jogging, though denies pain. Patient did not jog earlier today, has been compliant with HEP.    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    Limitations Walking;Lifting;Sitting;Standing   How long can you sit comfortably? Patient reports anything longer than 45 minutes bothers it.    How long can you stand comfortably? Patient reports anything longer than 45 minutes bothers it.    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    Currently in Pain? Yes   Pain Score --  Reports soreness in R groin area   Pain Location Groin   Pain Orientation Right  A little in L likely from compensation   Pain Descriptors / Indicators Aching;Sore   Pain Type Chronic pain;Surgical pain   Pain Onset More than a month ago   Pain Frequency Intermittent   Aggravating Factors  Jogging, hip flexion      Squats with 5# DB x 10 -- some  mild soreness in anterior hip/thigh reported.   Deadlifts with 20# x 10 for 2 sets (mild rounding in lumbar spine noted) -- reported feeling in HS and and gluteals primarily.   Half kneeling hip flexor stretch x 4 per side for 20" for decreased soreness.   Stir the pot x 12 per side on grey thera-ball   Half kneeling hip flexor stretch x 5 per side x 10-15" for pain relief (noted decreased pain/soreness after this)   Leg curls on OMEGA x 15 repetitions at 25# (easy) completed 35# x 15 repetitions                            PT Education - 04/04/16 1024    Education provided Yes   Education Details Continue with gentle jogging and monitor pain/soreness levels.    Person(s) Educated Patient   Methods Explanation;Demonstration   Comprehension Verbalized understanding;Returned demonstration             PT Long Term Goals - 02/29/16 1752      PT LONG TERM GOAL #1   Title Patient will jog at least 1 mile with no increase in pain to demonstrate ability to tolerate athletic activities.      PT LONG  TERM GOAL #2   Title Patient will perform full depth squat with 20# of external load with no increase in pain to demonstrate ability to tolerate recreational activities.    Time 6   Period Weeks   Status New     PT LONG TERM GOAL #3   Title Patient will perform single leg box jumps to at least 10" box with no valgus or pain noted to demonstrate appropriate power and strength of hip abductors for return to sport.    Period Weeks   Status New     PT LONG TERM GOAL #4   Title Patient will demonstrate at least 90% distance on triple hop, crossover test on RLE compared to LLE to demonstrate ability to return to sport.    Time 12   Period Weeks               Plan - 04/04/16 1025    Clinical Impression Statement Patient reports soreness after jogging, appears to be relieved after hip flexor stretch. She is able to tolerate gradual progression in loading of  LEs, though not quite able to tolerate plyometrics or speed based work at this time. She is progressing well with core strengthening/dynamic control at this time.    Rehab Potential Excellent   Clinical Impairments Affecting Rehab Potential Athletic background, rehab starting just after therapy    PT Frequency 2x / week   PT Duration 12 weeks   PT Treatment/Interventions ADLs/Self Care Home Management;Aquatic Therapy;Cryotherapy;Chief Operating Officerlectrical Stimulation;Traction;Balance training;Therapeutic exercise;Therapeutic activities;Manual techniques;Gait training;Stair training;Patient/family education   PT Next Visit Plan Progress through protocol provided as tolerated    PT Home Exercise Plan Continue with her current HEP adding single leg bridging, sidelying clamshells, limiting hip flexor stretching.    Consulted and Agree with Plan of Care Patient      Patient will benefit from skilled therapeutic intervention in order to improve the following deficits and impairments:  Abnormal gait, Decreased endurance, Pain, Decreased strength, Difficulty walking, Decreased range of motion, Decreased mobility  Visit Diagnosis: Difficulty in walking, not elsewhere classified  Status post hip surgery  Pain in right hip     Problem List There are no active problems to display for this patient.  Meredith English, PT, DPT     04/04/2016, 10:32 AM  [facility] [address] [place], [place], [address] Phone: [phone]   Fax:  [phone]  Name: Meredith English MRN: [id number] Date of Birth: 1995-07-20

## 2016-04-06 ENCOUNTER — Ambulatory Visit: Payer: 59 | Admitting: Physical Therapy

## 2016-04-06 DIAGNOSIS — Z9889 Other specified postprocedural states: Secondary | ICD-10-CM

## 2016-04-06 DIAGNOSIS — R262 Difficulty in walking, not elsewhere classified: Secondary | ICD-10-CM

## 2016-04-06 DIAGNOSIS — M25551 Pain in right hip: Secondary | ICD-10-CM

## 2016-04-06 NOTE — Patient Instructions (Addendum)
Hip flexor stretch on blue foam pad   Single leg squats on TG at 13 (12 repetitions), at 17 (12 repetitions), at 25 (12 repetitions)   TG Squat jumps with blue foam pad on base for shock absorption x 5 for 4 sets   Bridging with ball curls for HS activity (attempted to make harder with BOSU on stance leg) x 20 with abductions as well.  Nordic HS Curls x 20 (2nd set - 10 repetitions before fatigue)   Box Jumps with step down --   To 1 step x 5 for 2 sets, to 2 steps x 2 sets for 5  TRX planks x 15" holds for 3 sets   Single leg press x 45# for 12 (easy) x 65# for 12 for 2 sets

## 2016-04-07 NOTE — Therapy (Signed)
[facility]IC[facility] [address] [place], [address] Phone: [phone]   Fax:  [phone]  Physical Therapy Treatment  Patient Details  Name: Meredith English MRN: [id number] Date of Birth: 1995/07/08 No Data Recorded  Encounter Date: 04/06/2016      PT End of Session - 04/06/16 1822    Visit Number 10   Number of Visits 25   Date for PT Re-Evaluation 05/23/16   PT Start Time 1731   PT Stop Time 1816   PT Time Calculation (min) 45 min   Activity Tolerance Patient tolerated treatment well   Behavior During Therapy [facility] for tasks assessed/performed      No past medical history on file.  No past surgical history on file.  There were no vitals filed for this visit.      Subjective Assessment - 04/06/16 1752    Subjective Patient reports she had a good check in with MD who informed her she is ahead of schedule and happy with her progress. She has been jogging with decreased pain/symptoms afterwards.   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    Limitations Walking;Lifting;Sitting;Standing   How long can you sit comfortably? Patient reports anything longer than 45 minutes bothers it.    How long can you stand comfortably? Patient reports anything longer than 45 minutes bothers it.    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    Currently in Pain? No/denies      Hip flexor stretch on blue foam pad x5 per side for 10" holds   Single leg squats on TG at 13 (12 repetitions), at 17 (12 repetitions), at 25 (12 repetitions)   TG Squat jumps with blue foam pad on base for shock absorption x 5 for 4 sets (had to lower from roughly 20 to 13 for less pain, at lower angle patient reported no pain)    Bridging with ball curls for HS activity (attempted to make harder with BOSU on stance leg) x 20 with abductions as well. -- too easy   Nordic HS Curls x 20 (2nd set - 10 repetitions before fatigue)   Box Jumps with step down --   To 1 step x 5 for 2 sets, to 2 steps x 2 sets for 5 (no pain reported)   TRX planks x 15" holds for 3 sets -- challenging  Single leg press x 45# for 12 (easy) x 65# for 12 for 2 sets                             PT Education - 04/06/16 1822    Education provided Yes   Education Details Consolidated HEP and instructed patient to begin to progress jogging speed/distance on straights of track.    Person(s) Educated Patient   Methods Explanation;Handout   Comprehension Verbalized understanding             PT Long Term Goals - 02/29/16 1752      PT LONG TERM GOAL #1   Title Patient will jog at least 1 mile with no increase in pain to demonstrate ability to tolerate athletic  activities.      PT LONG TERM GOAL #2   Title Patient will perform full depth squat with 20# of external load with no increase in pain to demonstrate ability to tolerate recreational activities.    Time 6   Period Weeks   Status New     PT LONG TERM GOAL #3   Title Patient will perform single leg box jumps to at least 10" box with no valgus or pain noted to demonstrate appropriate power and strength of hip abductors for return to sport.    Period Weeks   Status New     PT LONG TERM GOAL #4   Title Patient will demonstrate at least 90% distance on triple hop, crossover test on RLE compared to LLE to demonstrate ability to return to sport.    Time 12   Period Weeks               Plan - 04/06/16 1822    Clinical Impression Statement Patient reports tolerating jogging in previous weeks, she initially has discomfort with shock from controlled landings in this session, which dissipates with appropriate management of load adjustments. She  demonstrates core strength deficits at this time relative to her anticipated athletic demands, though improving in her LE strength. Educated patient to continue to progress with jogging speed and distance. Patient would benefit from continued plyometric and force dissipation training to complete return to sport training.    Rehab Potential Excellent   Clinical Impairments Affecting Rehab Potential Athletic background, rehab starting just after therapy    PT Frequency 2x / week   PT Duration 12 weeks   PT Treatment/Interventions ADLs/Self Care Home Management;Aquatic Therapy;Cryotherapy;Chief Operating Officerlectrical Stimulation;Traction;Balance training;Therapeutic exercise;Therapeutic activities;Manual techniques;Gait training;Stair training;Patient/family education   PT Next Visit Plan Progress through protocol provided as tolerated    PT Home Exercise Plan Continue with her current HEP adding single leg bridging, sidelying clamshells, limiting hip flexor stretching.    Consulted and Agree with Plan of Care Patient      Patient will benefit from skilled therapeutic intervention in order to improve the following deficits and impairments:  Abnormal gait, Decreased endurance, Pain, Decreased strength, Difficulty walking, Decreased range of motion, Decreased mobility  Visit Diagnosis: Difficulty in walking, not elsewhere classified  Status post hip surgery  Pain in right hip     Problem List There are no active problems to display for this patient.   Alva Garnetatrick Delonna Ney 04/07/2016, 9:44 AM  [facility] [address] [place], [place], [address] Phone: [phone]   Fax:  [phone]  Name: Meredith English MRN: [id number] Date of Birth: 1995/12/16

## 2016-04-12 ENCOUNTER — Ambulatory Visit: Payer: 59 | Admitting: Physical Therapy

## 2016-04-12 DIAGNOSIS — R262 Difficulty in walking, not elsewhere classified: Secondary | ICD-10-CM | POA: Diagnosis not present

## 2016-04-12 DIAGNOSIS — M25551 Pain in right hip: Secondary | ICD-10-CM

## 2016-04-12 DIAGNOSIS — Z9889 Other specified postprocedural states: Secondary | ICD-10-CM

## 2016-04-12 NOTE — Patient Instructions (Addendum)
LEFS - 74/80  1/2 kneeling hip flexor stretch x 3 per side for 30" holds   TG jumps with blue foam x 5 for 5 sets (1st 2 at L 17, next at L 21 for 3 sets cuing for eccentric control)  TG Single leg squats at level 21 x 12 for 3 sets bilaterally   Hopping in place x 30"   Lunges with rotations x 8 per side for 3 sets  OMEGA HS Curls x 8 for 3 sets at 45# x 12, x12, 55# x 8   Leg Press x 8 repetitions at 85# x 8 repetitions

## 2016-04-12 NOTE — Therapy (Signed)
[facility] [address], [place], [address] Phone: [phone]   Fax:  [phone]  Physical Therapy Treatment  Patient Details  Name: Meredith English MRN: [id number] Date of Birth: 03/12/1996 No Data Recorded  Encounter Date: 04/12/2016      PT End of Session - 04/12/16 0948    Visit Number 11   Number of Visits 25   Date for PT Re-Evaluation 05/23/16   PT Start Time 0902   PT Stop Time 0945   PT Time Calculation (min) 43 min   Activity Tolerance Patient tolerated treatment well   Behavior During Therapy [facility] for tasks assessed/performed      No past medical history on file.  No past surgical history on file.  There were no vitals filed for this visit.      Subjective Assessment - 04/12/16 0904    Subjective Patient reports she jog-walked 4 laps on Friday and felt like she could keep going. She continues to have soreness after jogging, but this has reduced from initial times jogging. The soreness has also reduced after the sessions.    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    Limitations Walking;Lifting;Sitting;Standing   How long can you sit comfortably? Patient reports anything longer than 45 minutes bothers it.    How long can you stand comfortably? Patient reports anything longer than 45 minutes bothers it.    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    Currently in Pain? No/denies      LEFS - 74/80  1/2 kneeling hip flexor stretch x 3 per side for 30" holds   TG jumps with blue foam x 5 for 5 sets (1st 2 at L 17, next at L 21 for 3 sets cuing for eccentric control)  TG Single leg squats at level 21 x 12 for 3 sets bilaterally   Hopping in place x 30"   Lunges  with rotations x 8 per side for 3 sets  OMEGA HS Curls x 8 for 3 sets at 45# x 12, x12, 55# x 8   Leg Press x 8 repetitions at 85# x 8 repetitions   Side planks bilaterally x 5 repetitions with hip abduction x 5" holds for 2 sets (quite challenging for her).                            PT Education - 04/12/16 0948    Education provided Yes   Education Details Will see her perform running on track tomorrow and email Dr. Elisha Headland.    Person(s) Educated Patient   Methods Explanation   Comprehension Verbalized understanding             PT Long Term Goals - 02/29/16 1752      PT LONG TERM GOAL #1   Title Patient will jog at least 1 mile with no increase in pain to demonstrate ability to tolerate athletic activities.      PT LONG TERM GOAL #2   Title Patient will perform full depth squat with 20# of external load with no increase in pain to demonstrate ability to tolerate recreational  activities.    Time 6   Period Weeks   Status New     PT LONG TERM GOAL #3   Title Patient will perform single leg box jumps to at least 10" box with no valgus or pain noted to demonstrate appropriate power and strength of hip abductors for return to sport.    Period Weeks   Status New     PT LONG TERM GOAL #4   Title Patient will demonstrate at least 90% distance on triple hop, crossover test on RLE compared to LLE to demonstrate ability to return to sport.    Time 12   Period Weeks               Plan - 04/12/16 [id number]    Clinical Impression Statement Patient reports decreasing pain and soreness after jogging last week. She was able to tolerate higher level plyometric and strengthening activities in this session with a "good soreness" afterwards. PT will assess her running on track tomorrow for mechanics, tolerance for speed and endurance.    Rehab Potential Excellent   Clinical Impairments Affecting Rehab Potential Athletic background, rehab starting just after therapy     PT Frequency 2x / week   PT Duration 12 weeks   PT Treatment/Interventions ADLs/Self Care Home Management;Aquatic Therapy;Cryotherapy;Chief Operating Officerlectrical Stimulation;Traction;Balance training;Therapeutic exercise;Therapeutic activities;Manual techniques;Gait training;Stair training;Patient/family education   PT Next Visit Plan Progress through protocol provided as tolerated    PT Home Exercise Plan Continue with her current HEP adding single leg bridging, sidelying clamshells, limiting hip flexor stretching.    Consulted and Agree with Plan of Care Patient      Patient will benefit from skilled therapeutic intervention in order to improve the following deficits and impairments:  Abnormal gait, Decreased endurance, Pain, Decreased strength, Difficulty walking, Decreased range of motion, Decreased mobility  Visit Diagnosis: Difficulty in walking, not elsewhere classified  Status post hip surgery  Pain in right hip     Problem List There are no active problems to display for this patient.  Kerin RansomPatrick A McNamara, PT, DPT    04/12/2016, 9:52 AM  [facility] [address] [place], [place], [address] Phone: [phone]   Fax:  [phone]  Name: Evelina DunGrace Krull MRN: [id number] Date of Birth: 1996/03/04

## 2016-04-14 ENCOUNTER — Ambulatory Visit: Payer: 59 | Admitting: Physical Therapy

## 2016-04-14 DIAGNOSIS — R262 Difficulty in walking, not elsewhere classified: Secondary | ICD-10-CM

## 2016-04-14 DIAGNOSIS — Z9889 Other specified postprocedural states: Secondary | ICD-10-CM

## 2016-04-14 DIAGNOSIS — M25551 Pain in right hip: Secondary | ICD-10-CM

## 2016-04-14 NOTE — Patient Instructions (Signed)
Hip flexor stretch in 1/2 kneeling position   Side stepping with blue t-band x 12 per side for 2 sets.   Resisted heiden's with black WOD band x 8, x 6    Single leg jumps with blue foam pad for landing on TG at lvl 24 4 sets x 5 repetitions

## 2016-04-14 NOTE — Therapy (Signed)
[facility] [address] [place], [place], [address] Phone: [phone]   Fax:  [phone]  Physical Therapy Treatment  Patient Details  Name: Meredith English MRN: [id number] Date of Birth: 1996-06-13 No Data Recorded  Encounter Date: 04/14/2016      PT End of Session - 04/14/16 1246    Visit Number 12   Number of Visits 25   Date for PT Re-Evaluation 05/23/16   PT Start Time 1105   PT Stop Time 1140   PT Time Calculation (min) 35 min   Activity Tolerance Patient tolerated treatment well   Behavior During Therapy [facility] for tasks assessed/performed      No past medical history on file.  No past surgical history on file.  There were no vitals filed for this visit.      Subjective Assessment - 04/14/16 1110    Subjective Patient reports she has muscle soreness, particularly in her posterior hip. She denies any joint pain. Also reports pain/soreness in lateral compartment of her lower leg consistent with muscle fatigue.     Pertinent History FAI sx for R hip on January 05, 2016 in [place], began PT a week after sx and has been working with Su HoffBen Fisher x 1 per week at [employer].    Limitations Walking;Lifting;Sitting;Standing   How long can you sit comfortably? Patient reports anything longer than 45 minutes bothers it.    How long can you stand comfortably? Patient reports anything longer than 45 minutes bothers it.    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    Currently in Pain? No/denies  Reports more soreness, not joint related pain      Hip flexor stretch in 1/2 kneeling position x 3 per side for 30"  Side stepping with blue t-band x 12 per side for 2 sets.   Resisted heiden's with black WOD band x 8, x 6    Single leg jumps with blue foam pad for landing on TG at lvl 24 4 sets x 5 repetitions  (reported fatigue in lateral LE bilaterally)   Performed manual PF and Inversion stretch x 3 minutes bilaterally with relief noted  Double knee to chest x 12 performed with relief of posterior hip tightness noted.                             PT Education - 04/14/16 1246    Education provided Yes   Education Details Progression of running, areas PT will focus on to normalize gait pattern.    Person(s) Educated Patient   Methods Explanation   Comprehension Verbalized understanding             PT Long Term Goals - 02/29/16 1752      PT LONG TERM GOAL #1   Title Patient will jog at least 1 mile with no increase in pain to demonstrate ability to tolerate athletic activities.      PT LONG TERM GOAL #2   Title Patient will perform full depth squat with 20# of external load with no increase in pain to demonstrate ability to tolerate recreational activities.    Time 6   Period Weeks   Status New     PT LONG TERM GOAL #3   Title Patient will perform  single leg box jumps to at least 10" box with no valgus or pain noted to demonstrate appropriate power and strength of hip abductors for return to sport.    Period Weeks   Status New     PT LONG TERM GOAL #4   Title Patient will demonstrate at least 90% distance on triple hop, crossover test on RLE compared to LLE to demonstrate ability to return to sport.    Time 12   Period Weeks               Plan - 04/14/16 1246    Clinical Impression Statement Patient seen yesterday while running on the track, observed to have functional Trendelenburg during faster paced running. Addressed in this session with single leg frontal and sagittal plane jumping/eccentric control work. Patient reports soreness in her LEs from running/power work, thus session shortened.    Rehab Potential Excellent   Clinical Impairments Affecting Rehab Potential Athletic background, rehab starting just after therapy    PT Frequency 2x /  week   PT Duration 12 weeks   PT Treatment/Interventions ADLs/Self Care Home Management;Aquatic Therapy;Cryotherapy;Chief Operating Officer;Therapeutic exercise;Therapeutic activities;Manual techniques;Gait training;Stair training;Patient/family education   PT Next Visit Plan Progress through protocol provided as tolerated    PT Home Exercise Plan Continue with her current HEP adding single leg bridging, sidelying clamshells, limiting hip flexor stretching.    Consulted and Agree with Plan of Care Patient      Patient will benefit from skilled therapeutic intervention in order to improve the following deficits and impairments:  Abnormal gait, Decreased endurance, Pain, Decreased strength, Difficulty walking, Decreased range of motion, Decreased mobility  Visit Diagnosis: Difficulty in walking, not elsewhere classified  Status post hip surgery  Pain in right hip     Problem List There are no active problems to display for this patient.   Kerin Ransom, PT, DPT    04/14/2016, 12:48 PM  [facility] PHYSICAL AND SPORTS MEDICINE [address] [place], [address] Phone: [phone]   Fax:  [phone]  Name: Meredith English MRN: [id number] Date of Birth: 20-Dec-1995

## 2016-04-19 ENCOUNTER — Ambulatory Visit: Payer: 59 | Admitting: Physical Therapy

## 2016-04-19 DIAGNOSIS — Z9889 Other specified postprocedural states: Secondary | ICD-10-CM

## 2016-04-19 DIAGNOSIS — M25551 Pain in right hip: Secondary | ICD-10-CM

## 2016-04-19 DIAGNOSIS — R262 Difficulty in walking, not elsewhere classified: Secondary | ICD-10-CM | POA: Diagnosis not present

## 2016-04-19 NOTE — Therapy (Signed)
[facility] [address] [place], [place], [address] Phone: [phone]   Fax:  [phone]  Physical Therapy Treatment  Patient Details  Name: Meredith English MRN: [id number] Date of Birth: 1996/04/18 No Data Recorded  Encounter Date: 04/19/2016      PT End of Session - 04/19/16 0948    Visit Number 13   Number of Visits 25   Date for PT Re-Evaluation 05/23/16   PT Start Time 0907   Activity Tolerance Patient tolerated treatment well   Behavior [facility] Therapy [facility] for [facility] assessed/performed      No past medical history on file.  No past surgical history on file.  There were no vitals filed for this visit.      Subjective Assessment - 04/19/16 0909    Subjective Patient reports she was sore for several days after previous running session. She reports the soreness went away after several days.    Pertinent History FAI sx for R hip on January 05, 2016 in [place], began PT a week after sx and has been working with Su [employer]Ben Fisher x 1 per week at [employer].    Limitations Walking;Lifting;Sitting;Standing   How long can you sit comfortably? Patient reports anything longer than 45 minutes bothers it.    How long can you stand comfortably? Patient reports anything longer than 45 minutes bothers it.    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    Currently in Pain? No/denies      bil hip flexor stretch in half kneeling on airex pad, 3x30 sec each  Walking lunging with trunk rotations with 20#KB, 15 ft x 4 laps; challenging for pt per pt report; pt demo slight unsteadiness throughout  Walking lunge jumps x 15 ft with jumping to 2 feet then switching; 15 ft, 2x3 laps with split lunge jumps; pt reported as "tiring;" demo'd slight decrease in power generation as evidenced by difficulty with jumping higher  Bil  lateral box jumps with 2 risers, 3x10; pt demo mild fatigue  bil fwd single-leg box jumps with no risers, 2x10; pt demo increased difficulty/decreased control on RLE and demo'd fatigue  bil split squats with rear foot elevated on green chair; x10 with body weight; 2x10 with 20#; pt demo fatigue and reported as more challenging with weights                            PT Education - 04/19/16 0946    Education provided Yes   Education Details exercise technique, start running full laps    Person(s) Educated Patient   Methods Explanation   Comprehension Verbalized understanding             PT Long Term Goals - 02/29/16 1752      PT LONG TERM GOAL #1   Title Patient will jog at least 1 mile with no increase in pain to demonstrate ability to tolerate athletic activities.      PT LONG TERM GOAL #2   Title Patient will perform full depth squat with 20# of external load with no increase in pain to demonstrate ability to tolerate recreational activities.    Time 6   Period Weeks   Status New     PT LONG TERM GOAL #3   Title Patient  will perform single leg box jumps to at least 10" box with no valgus or pain noted to demonstrate appropriate power and strength of hip abductors for return to sport.    Period Weeks   Status New     PT LONG TERM GOAL #4   Title Patient will demonstrate at least 90% distance on triple hop, crossover test on RLE compared to LLE to demonstrate ability to return to sport.    Time 12   Period Weeks               Plan - 04/19/16 1106    Clinical Impression Statement Patient progressed with plyometric and faster intention movements in this session to address functional Trendelenburg during faster running paces. She becomes fatigued and noticeably challenged by plyometrics, no pain reported. She has been getting sore after running, but is not having pain like she did prior to her operation. Patient will continue to be progressed  until appropriate for reconditioning with strength and conditioning staff.    Rehab Potential Excellent   Clinical Impairments Affecting Rehab Potential Athletic background, rehab starting just after therapy    PT Frequency 2x / week   PT Duration 12 weeks   PT Treatment/Interventions ADLs/Self Care Home Management;Aquatic Therapy;Cryotherapy;Chief Operating Officerlectrical Stimulation;Traction;Balance training;Therapeutic exercise;Therapeutic activities;Manual techniques;Gait training;Stair training;Patient/family education   PT Next Visit Plan Progress through protocol provided as tolerated    PT Home Exercise Plan Continue with her current HEP adding single leg bridging, sidelying clamshells, limiting hip flexor stretching.    Consulted and Agree with Plan of Care Patient      Patient will benefit from skilled therapeutic intervention in order to improve the following deficits and impairments:  Abnormal gait, Decreased endurance, Pain, Decreased strength, Difficulty walking, Decreased range of motion, Decreased mobility  Visit Diagnosis: Difficulty in walking, not elsewhere classified  Status post hip surgery  Pain in right hip     Problem List There are no active problems to display for this patient.  Kerin RansomPatrick A McNamara, PT, DPT    04/19/2016, 11:09 AM  [place] [address] [place], [place], [address] Phone: [phone]   Fax:  [phone]  Name: Meredith DunGrace Morrell MRN: [id number] Date of Birth: 18-Jan-1996

## 2016-04-19 NOTE — Patient Instructions (Signed)
bil hip flexor stretch in half kneeling on airex pad, 3x30 sec each  Walking lunging with trunk rotations with 20#KB, 15 ft x 4 laps; challenging for pt per pt report; pt demo slight unsteadiness throughout  Walking lunge jumps x 15 ft with jumping to 2 feet then switching; 15 ft, 2x3 laps with split lunge jumps; pt reported as "tiring;" demo'd slight decrease in power generation as evidenced by difficulty with jumping higher  Bil lateral box jumps with 2 risers, 3x10; pt demo mild fatigue  bil fwd single-leg box jumps with no risers, 2x10; pt demo increased difficulty/decreased control on RLE and demo'd fatigue  bil split squats with rear foot elevated on green chair; x10 with body weight; 2x10 with 20#; pt demo fatigue and reported as more challenging with weights

## 2016-04-21 ENCOUNTER — Ambulatory Visit: Payer: 59 | Attending: Family Medicine | Admitting: Physical Therapy

## 2016-04-21 DIAGNOSIS — M25551 Pain in right hip: Secondary | ICD-10-CM | POA: Insufficient documentation

## 2016-04-21 DIAGNOSIS — R262 Difficulty in walking, not elsewhere classified: Secondary | ICD-10-CM | POA: Insufficient documentation

## 2016-04-21 DIAGNOSIS — Z9889 Other specified postprocedural states: Secondary | ICD-10-CM | POA: Diagnosis present

## 2016-04-21 NOTE — Patient Instructions (Signed)
Educated patient and took through PEP program for warm up prior to all running competitions   PEP program includes   Forwards shuttle run x 30" Lateral shuttle run x 30" Backwards shuttle x30" Lunges (forwards) x 60"  Nordic HS Curls x 60"  Standing 1 foot calf raises x 30" per side  Lateral Jumps over line x 30" Forward Jumps over line x 30"  Scissor jumps x 30" Run and decelerate in 3 step drill x 60"  A-skip x 60"  Standing quadricep stretch x 30" per leg Standing calf stretch x 30" per leg Figure 4 HS stretch x 30" per leg  Inner thigh stretch x 30" Hip flexor stretch in half kneeling x 30" per leg

## 2016-04-21 NOTE — Therapy (Signed)
[facility] [address] [place], [place], [address] Phone: [phone]   Fax:  [phone]  Physical Therapy Treatment  Patient Details  Name: Meredith DunGrace English MRN: [id number] Date of Birth: 1996/01/22 No Data Recorded  Encounter Date: 04/21/2016      PT End of Session - 04/21/16 1137    Visit Number 14   Number of Visits 25   Date for PT Re-Evaluation 05/23/16   PT Start Time 1115   PT Stop Time 1200   PT Time Calculation (min) 45 min   Activity Tolerance Patient tolerated treatment well   Behavior During Therapy [facility] for tasks assessed/performed      No past medical history on file.  No past surgical history on file.  There were no vitals filed for this visit.      Subjective Assessment - 04/21/16 1122    Subjective Patient reports no pain today, she has a 6 day a week training schedule currently and has been taking Wednesdays off.    Pertinent History FAI s[place] for R hip on January 05, [place], began PT a week after sx and has been working with Su HoffBen Fisher x 1 per week at [employer].    Limitations Walking;Lifting;Sitting;Standing   How long can you sit comfortably? Patient reports anything longer than 45 minutes bothers it.    How long can you stand comfortably? Patient reports anything longer than 45 minutes bothers it.    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    Currently in Pain? No/denies      Educated patient and took through PEP program for warm up prior to all running competitions   PEP program includes   Forwards shuttle run x 30" Lateral shuttle run x 30" Backwards shuttle x30" Lunges (forwards) x 60"  Nordic HS Curls x 60"  Standing 1 foot calf raises x 30" per side  Lateral Jumps over line x 30" Forward Jumps over line x 30"  Scissor jumps x 30" Run and decelerate in 3  step drill x 60"  A-skip x 60"  Standing quadricep stretch x 30" per leg Standing calf stretch x 30" per leg Figure 4 HS stretch x 30" per leg  Inner thigh stretch x 30" Hip flexor stretch in half kneeling x 30" per leg   Triple hop on RLE - 351 cm, 357 cm on LLE  Crossover Test - 300cm on RLE, 320 cm on LLE   Squat assessed - very mild weight transfer to LLE, though appropriate mechanics/depth noted.   Tuck jump assessment- mild decline in knee/hip flexion from ideal, weight shift mild to LLE   Single leg squat x 3 repetitions per side -- no deficits with LLE. With RLE noted to have poor femoral control, increased valgus and loss of balance due to poor lateral trunk flexion utilization for compensation   Depth Jump -- appropriate landings, mild weight shift to LLE.                             PT Education - 04/21/16 1137    Education provided Yes   Education Details Perform PEP program prior to running.    Person(s) Educated Patient   Methods Explanation;Handout   Comprehension Verbalized understanding  PT Long Term Goals - 02/29/16 1752      PT LONG TERM GOAL #1   Title Patient will jog at least 1 mile with no increase in pain to demonstrate ability to tolerate athletic activities.      PT LONG TERM GOAL #2   Title Patient will perform full depth squat with 20# of external load with no increase in pain to demonstrate ability to tolerate recreational activities.    Time 6   Period Weeks   Status New     PT LONG TERM GOAL #3   Title Patient will perform single leg box jumps to at least 10" box with no valgus or pain noted to demonstrate appropriate power and strength of hip abductors for return to sport.    Period Weeks   Status New     PT LONG TERM GOAL #4   Title Patient will demonstrate at least 90% distance on triple hop, crossover test on RLE compared to LLE to demonstrate ability to return to sport.    Time 12   Period  Weeks               Plan - 04/21/16 1138    Clinical Impression Statement Patient demonstrates excellent metrics for LSI on triple jump and crossover jump tests, minimal weightshifting from surgical side in squat. She does have loading and force dissipation deficits (shifting from R to L) with jumps and depth squats, though mild. Her primary deficit identified in single leg squat where she is able to complete without problem on LLE, on RLE noted to collapse into valgus and attempts to use trunkal side flexion to compensate. Provided this as part of a comprehensive HEP. Given her excellent metrics, lack of pain, and need for more sport specific performance training will have patient follow up with therapist in 3 weeks after she meets with orthopedic surgeon while working with strength and conditioning coach in the mean time.    Rehab Potential Excellent   Clinical Impairments Affecting Rehab Potential Athletic background, rehab starting just after therapy    PT Frequency 2x / week   PT Duration 12 weeks   PT Treatment/Interventions ADLs/Self Care Home Management;Aquatic Therapy;Cryotherapy;Chief Operating Officerlectrical Stimulation;Traction;Balance training;Therapeutic exercise;Therapeutic activities;Manual techniques;Gait training;Stair training;Patient/family education   PT Next Visit Plan Progress through protocol provided as tolerated    PT Home Exercise Plan Continue with her current HEP adding single leg bridging, sidelying clamshells, limiting hip flexor stretching.    Consulted and Agree with Plan of Care Patient      Patient will benefit from skilled therapeutic intervention in order to improve the following deficits and impairments:  Abnormal gait, Decreased endurance, Pain, Decreased strength, Difficulty walking, Decreased range of motion, Decreased mobility  Visit Diagnosis: Difficulty in walking, not elsewhere classified  Status post hip surgery  Pain in right hip     Problem List There  are no active problems to display for this patient.   Alva Garnetatrick McNamara 04/21/2016, 4:22 PM  [facility] [address] [place], [place], [address] Phone: [phone]   Fax:  [phone]  Name: Meredith DunGrace English MRN: [id number] Date of Birth: [birth year]

## 2016-04-25 ENCOUNTER — Ambulatory Visit: Payer: 59 | Admitting: Physical Therapy

## 2016-04-28 ENCOUNTER — Encounter: Payer: 59 | Admitting: Physical Therapy

## 2016-05-02 ENCOUNTER — Encounter: Payer: 59 | Admitting: Physical Therapy

## 2016-05-05 ENCOUNTER — Encounter: Payer: 59 | Admitting: Physical Therapy

## 2016-05-30 ENCOUNTER — Ambulatory Visit: Payer: 59 | Attending: Family Medicine | Admitting: Physical Therapy

## 2016-10-16 IMAGING — MR MR HIP*R* W/O CM
4 of 5 series · 29 of 40 positions shown · non-contrast
Comparison: None.

CLINICAL DATA: Right hip pain which began in June 2015 while
jumping hurdles. Initial encounter.

EXAM:
MR OF THE RIGHT HIP WITHOUT CONTRAST
TECHNIQUE: Multiplanar, multisequence MR imaging was performed. No intravenous
contrast was administered.

[Series 3: T1 · coronal · 4.0mm · 0.85mm/px · 8 of 33 slices shown (1 of 2)]
[im 1/33]
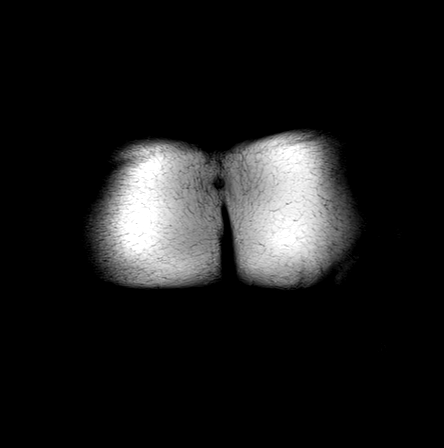
[im 5/33]
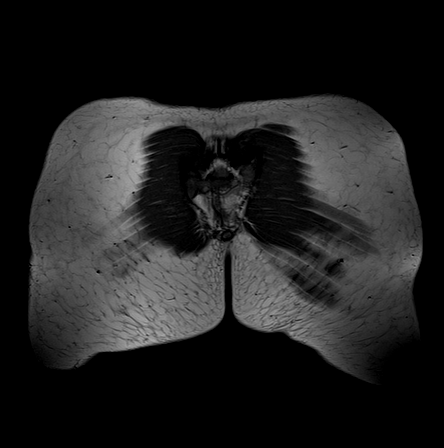
[im 10/33]
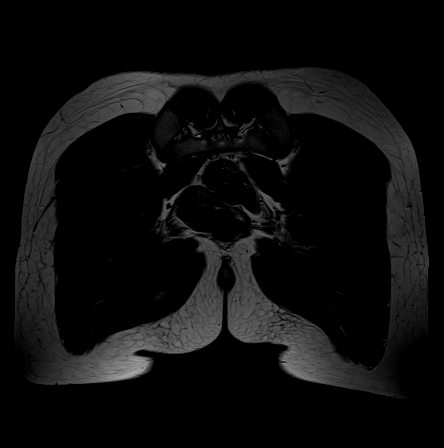
[im 14/33]
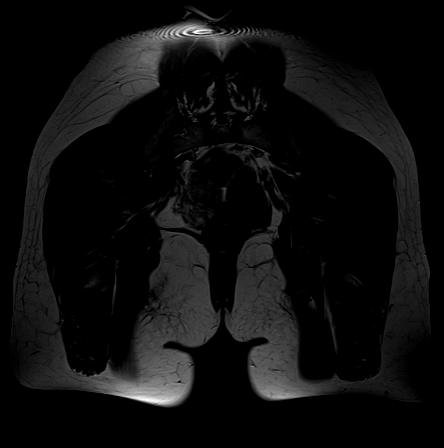
[im 19/33]
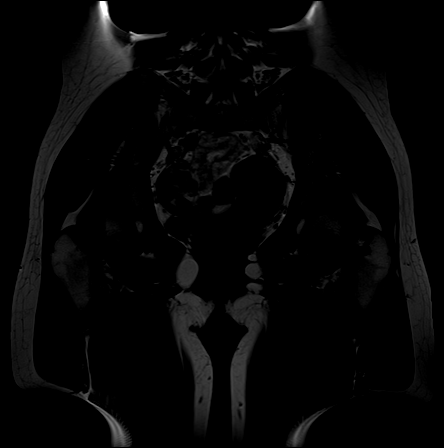
[im 23/33]
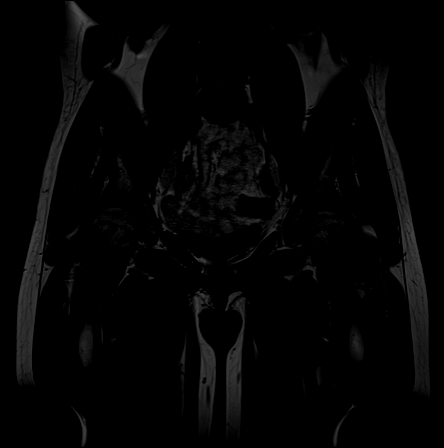
[im 28/33]
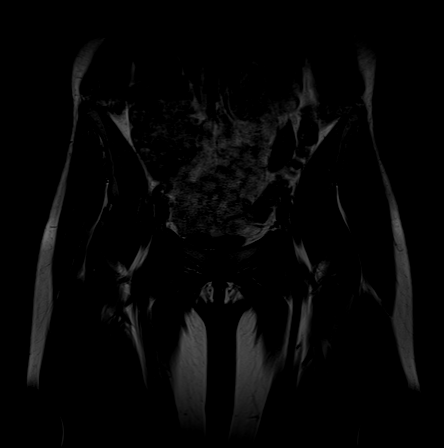
[im 33/33]
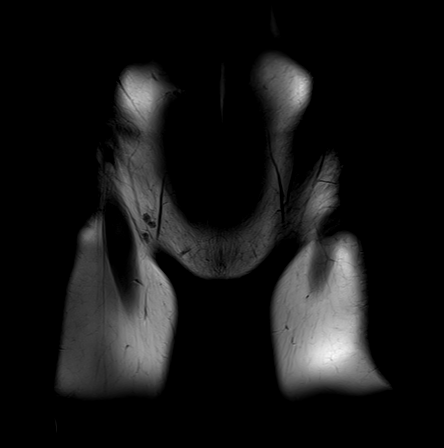

[Series 4: T2 fat-sat · axial · 4.5mm · 0.85mm/px · z∈[-124,+78]mm · 9 of 37 slices shown]
[im 1/37]
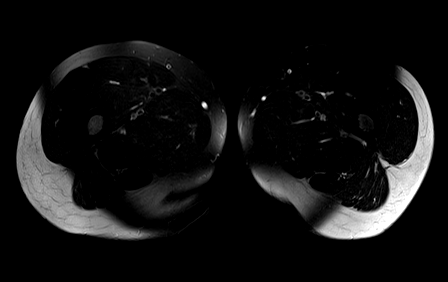
[im 5/37]
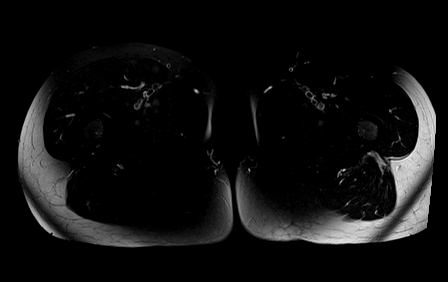
[im 10/37]
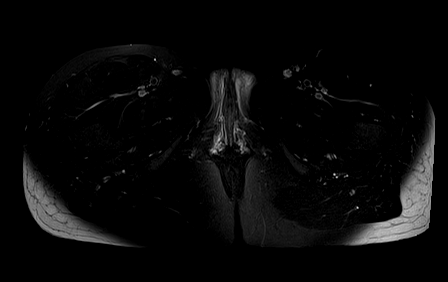
[im 14/37]
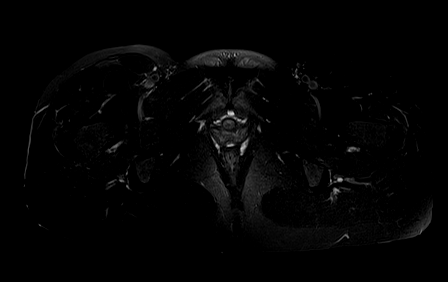
[im 19/37]
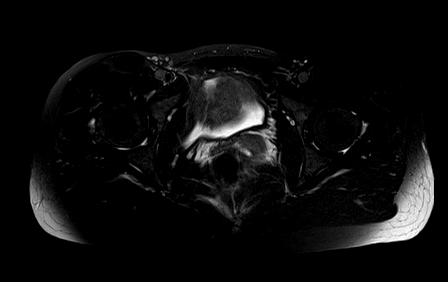
[im 23/37]
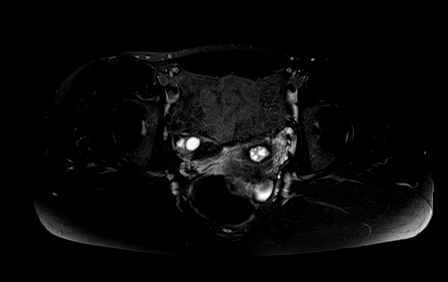
[im 28/37]
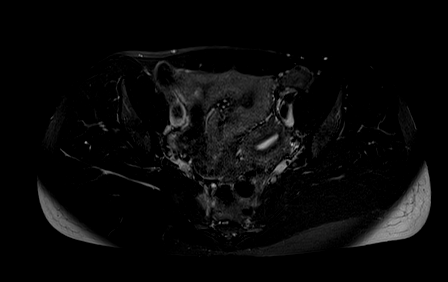
[im 32/37]
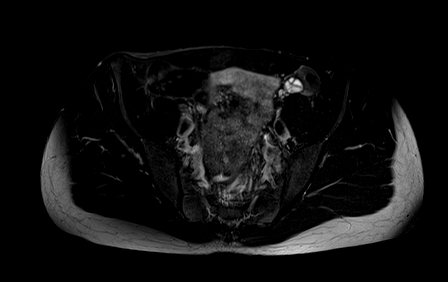
[im 37/37]
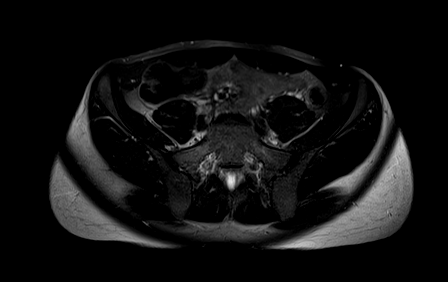

[Series 5: T1 · axial · 4.5mm · 0.85mm/px · z∈[-124,+50]mm · 6 of 37 slices shown (2 of 2)]
[im 1/37]
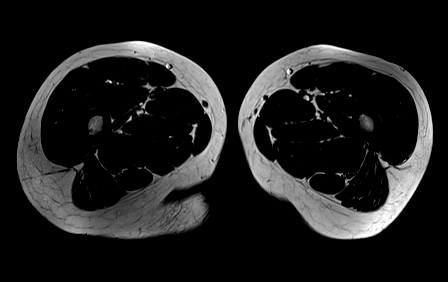
[im 5/37]
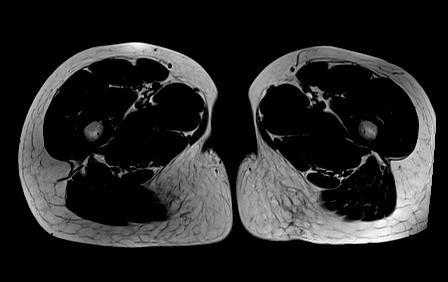
[im 10/37]
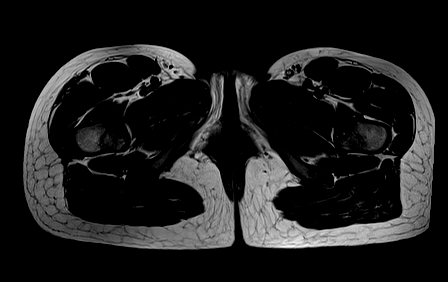
[im 14/37]
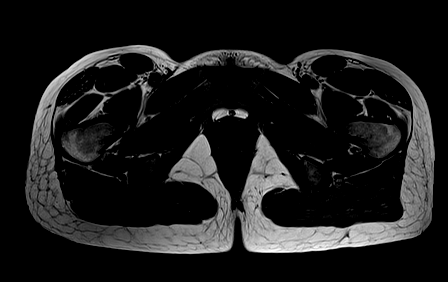
[im 19/37]
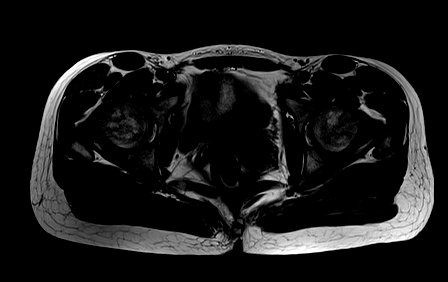
[im 32/37]
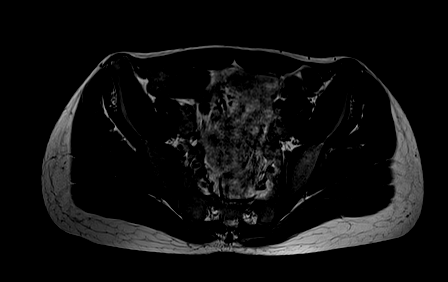

[Series 6: PD fat-sat · sagittal · 4.5mm · 0.29mm/px · 6 of 23 slices shown]
[im 1/23]
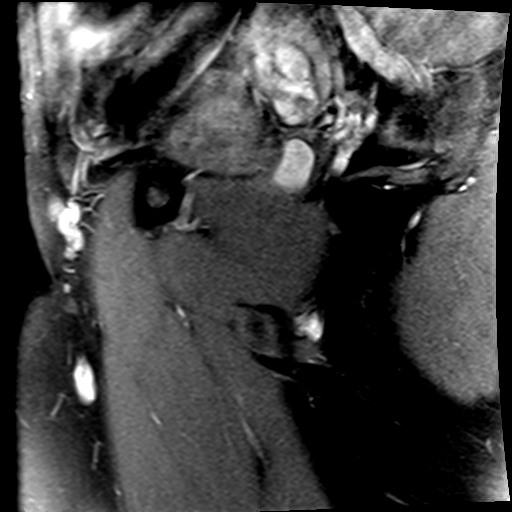
[im 5/23]
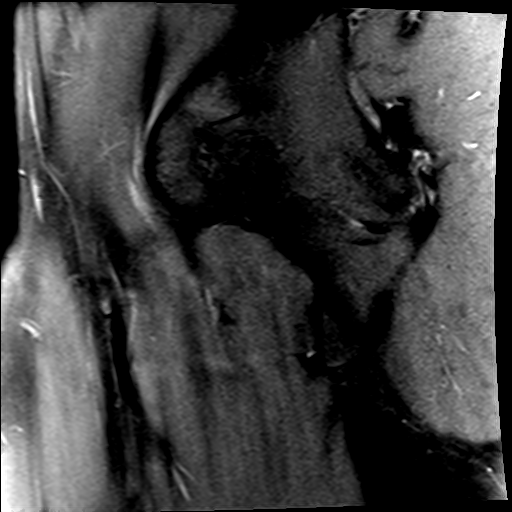
[im 9/23]
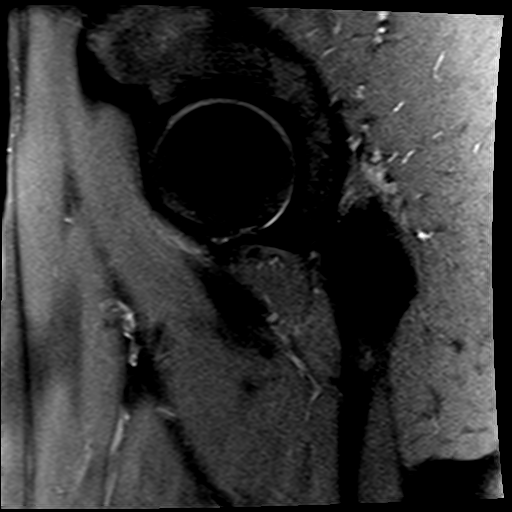
[im 14/23]
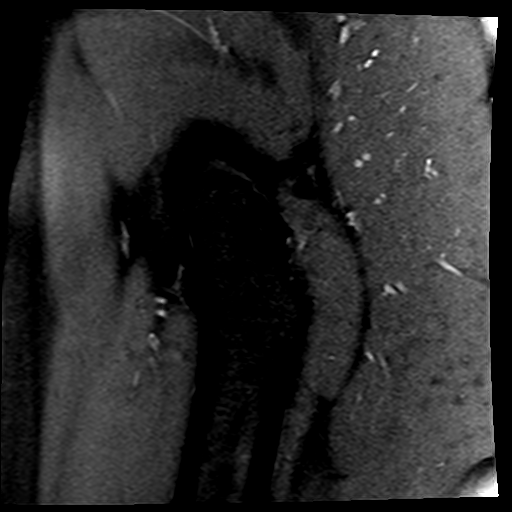
[im 18/23]
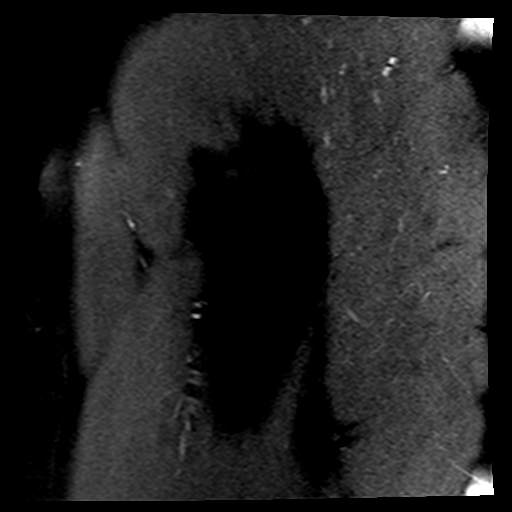
[im 23/23]
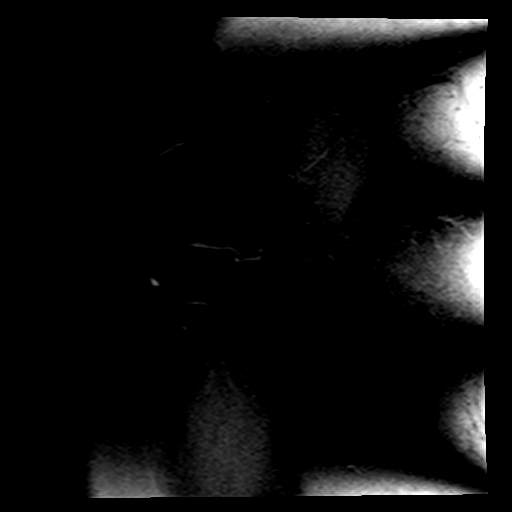

[29 of 40 positions shown; findings below may reference images not displayed]

FINDINGS: Bones: Normal marrow signal throughout. No fracture or stress
change. No avascular necrosis of the femoral heads.

Articular cartilage and labrum

Articular cartilage:  Appears normal.

Labrum:  Intact.

Joint or bursal effusion

Joint effusion:  None.

Bursae:  Unremarkable.

Muscles and tendons

Muscles and tendons:  Appear normal throughout.

Other findings

Miscellaneous:  Imaged intrapelvic contents are unremarkable.
IMPRESSION: Negative examination.  No finding to explain the patient's symptoms.

## 2017-02-14 ENCOUNTER — Encounter: Payer: Self-pay | Admitting: Family Medicine

## 2017-02-14 ENCOUNTER — Ambulatory Visit (INDEPENDENT_AMBULATORY_CARE_PROVIDER_SITE_OTHER): Payer: BLUE CROSS/BLUE SHIELD | Admitting: Family Medicine

## 2017-02-14 VITALS — BP 116/66 | HR 54 | Temp 97.9°F | Resp 14

## 2017-02-14 DIAGNOSIS — S060X0A Concussion without loss of consciousness, initial encounter: Secondary | ICD-10-CM

## 2017-02-14 NOTE — Progress Notes (Signed)
Patient presents today for follow-up regarding recent concussion. 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 Patient states that she had a mild headache and some fogginess after the injury. She denies having any vision problems, dizziness, nausea, vomiting. She states that her head initially felt heavy. Denies having any problems now. She denies any sleep issues. She is not taking any medications for headache or sleep. She denies having a concussion before. She denies having any problems with concentration. She has class this afternoon.  ROS: Negative except mentioned above. Vitals as per Epic GENERAL: NAD HEENT: No hematoma appreciated on occiput, no pharyngeal erythema, no exudate, no erythema of TMs, PERRL, EOMI, no cervical LAD RESP: CTA B CARD: RRR NEURO: CN II-XII grossly intact, -Rombergs  A/P: Concussion - symptoms seem to have resolved, reviewed Impact test, patient will see how class goes today, if she has no problems she can start to progress activity through protocol. Discussed plan with trainer. Patient will inform trainer and/or myself if any symptoms return.

## 2017-04-04 ENCOUNTER — Ambulatory Visit (INDEPENDENT_AMBULATORY_CARE_PROVIDER_SITE_OTHER): Payer: PRIVATE HEALTH INSURANCE | Admitting: Family Medicine

## 2017-04-04 VITALS — BP 104/65 | HR 62 | Temp 97.9°F | Resp 14

## 2017-04-04 DIAGNOSIS — Z87898 Personal history of other specified conditions: Secondary | ICD-10-CM

## 2017-04-04 DIAGNOSIS — R0602 Shortness of breath: Secondary | ICD-10-CM

## 2017-04-04 NOTE — Progress Notes (Signed)
Patient presents today with history of dizziness and vision changes that have happened on 2 occasions this cross-country season. Patient states that during 2 cross-country meets this year she has started to develop dizziness and then blurriness of vision three fourths of the way into a cross-country race. She denies a true syncopal episode. She admits that her symptoms resolve fully within 30 minutes. She denies any new medications or supplements. She typically drinks water before races because electrolyte beverages prior to races don't sit well with her stomach. She admits to eating a typical meal prior to racing. She denies any history of asthma or cardiac problems in the past. She does however admit to some shortness of breath before the symptoms develop. She is unsure whether she is just fatigued when this happens. She has not had any similar symptoms at rest or when she is practicing. 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 During her freshman year she did race once and did not have any of the problems above. She obviously has raced in high school as well and hasn't experienced any of the problems above before. Patient wonders whether her head injury at the end of the summer would have anything to do with her current symptoms. Patient slipped while diving and hit her head she believes on a rock. She sustained concussion symptoms. She recovered from those symptoms and returned back to normal activity. She denies any chronic headaches or other neurologic symptoms since the injury.  ROS: Negative except mentioned above. Vitals as per Epic. GENERAL: NAD HEENT: no pharyngeal erythema, no exudate, no erythema of TMs, no cervical LAD RESP: CTA B CARD: RRR, no carotid bruits appreciated NEURO: CN II-XII grossly intact   A/P: Dizziness and blurry vision prior to finishing races - discussed with patient that perhaps her breathing is different towards the end of the race that  is leading to her symptoms, she obviously is working harder with racing then during a normal practice, will order chest x-ray, would recommend cardiac evaluation as well, patient is not experiencing any of these symptoms during practice so will not pull her from practice at this time. She is to seek medical attention if any of her symptoms do change, persist or worsen. Will discuss above with trainer.

## 2017-04-05 NOTE — Progress Notes (Signed)
Cardiology Office Note  Date:  04/06/2017   ID:  Meredith English, DOB 1995-10-27, MRN [id number]  PCP:  System, Pcp Not In   Chief Complaint  Patient presents with  . other    Sob and dizziness. Meds reviewed verbally with pt.    HPI:  Meredith English is a 21 year old woman With no prior cardiac history History of Raynaud's Who presents by referral from Dr. Allena KatzPatel for consultation of her shortness of breath, dizziness  She is a Holiday representativejunior at [employer], competitive runner, did not run last year secondary to hip surgery  previously trained for steeple chase On 2 recent cross country competitions this year she has developed symptoms in the late stages of the race Initially has shortness of breath, Followed by vision blurriness, eventually "cannot see", loses balance, and has fallen down Struggles to get back up to finish the race.  Struggles to see where she is going Vision deficits lasting for 30 minutes  Day after the race continues to feel mildly short of breath walking around going to her classes She had an event recently, several days later still feels like she is at the tail end of recovering  denies any history of asthma or cardiac problems in the past. Denies any prior episodes on complications in freshman or sophomore year of college, no symptoms in high school  Reports eating a meal, staying hydrated before each race  Prior head injury end of last summer slipped while diving and hit her head she believes on a rock.  She sustained concussion symptoms. She recovered from those symptoms and returned back to normal activity.   EKG personally reviewed by myself on todays visit Shows normal sinus rhythm with rate 57 bpm, sinus arrhythmia, exaggerated voltage in limb leads  PMH:   has a past medical history of Reynolds syndrome (HCC).  PSH:    Past Surgical History:  Procedure Laterality Date  . HIP SURGERY Right     Current Outpatient Prescriptions  Medication Sig Dispense Refill   . Norgestim-Eth Estrad Triphasic (ORTHO TRI-CYCLEN, 28, PO) Take by mouth daily.     No current facility-administered medications for this visit.      Allergies:   Patient has no known allergies.   Social History:  The patient  reports that she has never smoked. She has never used smokeless tobacco. She reports that she does not drink alcohol or use drugs.   Family History:   family history is not on file.    Review of Systems: Review of Systems  Constitutional: Negative.   Eyes: Positive for blurred vision.  Respiratory: Positive for shortness of breath.   Cardiovascular: Negative.   Gastrointestinal: Negative.   Musculoskeletal: Negative.   Neurological: Positive for dizziness.       Near syncope  Psychiatric/Behavioral: Negative.   All other systems reviewed and are negative.    PHYSICAL EXAM: VS:  BP 113/69 (BP Location: Left Arm, Patient Position: Sitting, Cuff Size: Normal)   Pulse (!) 57   Ht 5\' 5"  (1.651 m)   Wt 127 lb 4 oz (57.7 kg)   LMP 03/23/2017 (Approximate)   BMI 21.18 kg/m  , BMI Body mass index is 21.18 kg/m. GEN: Well nourished, well developed, in no acute distress  HEENT: normal  Neck: no JVD, carotid bruits, or masses Cardiac: RRR; no murmurs, rubs, or gallops,no edema  Respiratory:  clear to auscultation bilaterally, normal work of breathing GI: soft, nontender, nondistended, + BS MS: no deformity or atrophy  Skin:  warm and dry, no rash Neuro:  Strength and sensation are intact Psych: euthymic mood, full affect    Recent Labs: No results found for requested labs within last 8760 hours.    Lipid Panel No results found for: CHOL, HDL, LDLCALC, TRIG    Wt Readings from Last 3 Encounters:  04/06/17 127 lb 4 oz (57.7 kg)       ASSESSMENT AND PLAN:  Shortness of breath -  Etiology of her symptoms is unclear, shortness of breath seems to present first followed by other symptoms including dizziness, blurred vision Recommended she  complete cardiopulmonary exercise testing with the O2 max measurement Echocardiogram has been scheduled given exaggerated voltage on EKG, shortness of breath and dizziness  Dizziness -  dizziness could possibly be be secondary to hypotension though this would be unexpected during her competitive cross-country race.  Her baseline blood pressure is low around 110 systolic, often lower and she does have bradycardia Testing as above to rule out other etiologies such as chronotropic incompetence, arrhythmia, abnormal gas exchange  Blurred vision Again possibly from low blood pressure given dizziness, falling on the cross-country course,  Though concerning given the long recovery time after the race is complete  Raynaud's disease without gangrene Likely unrelated to current presentation Mildly symptomatic Discussed calcium channel blockers  Disposition:   F/U as needed We will call her with the results of her treadmill testing and echocardiogram   Total encounter time more than 60 minutes  Greater than 50% was spent in counseling and coordination of care with the patient  Patient was seen in consultation for Dr. Allena Katz and will be referred back to her office for ongoing care of the issues detailed above    Orders Placed This Encounter  Procedures  . Cardiopulmonary exercise test  . EKG 12-Lead  . ECHOCARDIOGRAM COMPLETE     Signed, Dossie Arbour, M.D., Ph.D. 04/06/2017  [facility] [place], [place] [phone]

## 2017-04-06 ENCOUNTER — Ambulatory Visit (INDEPENDENT_AMBULATORY_CARE_PROVIDER_SITE_OTHER): Payer: BLUE CROSS/BLUE SHIELD | Admitting: Cardiovascular Disease

## 2017-04-06 ENCOUNTER — Ambulatory Visit
Admission: RE | Admit: 2017-04-06 | Discharge: 2017-04-06 | Disposition: A | Discharge status: Home / Self Care | Payer: BLUE CROSS/BLUE SHIELD | Source: Ambulatory Visit | Attending: Family Medicine | Admitting: Family Medicine

## 2017-04-06 ENCOUNTER — Encounter: Payer: Self-pay | Admitting: Cardiovascular Disease

## 2017-04-06 VITALS — BP 113/69 | HR 57 | Ht 65.0 in | Wt 127.2 lb

## 2017-04-06 DIAGNOSIS — R0602 Shortness of breath: Secondary | ICD-10-CM

## 2017-04-06 DIAGNOSIS — H538 Other visual disturbances: Secondary | ICD-10-CM | POA: Insufficient documentation

## 2017-04-06 DIAGNOSIS — I7300 Raynaud's syndrome without gangrene: Secondary | ICD-10-CM | POA: Insufficient documentation

## 2017-04-06 DIAGNOSIS — R42 Dizziness and giddiness: Secondary | ICD-10-CM

## 2017-04-06 NOTE — Patient Instructions (Addendum)
Medication Instructions:   No medication changes made  Read about calcium channel blockers for Rayneuds  Labwork:  No new labs needed  Testing/Procedures:  No further testing at this time  We will order an echocardiogram for abn EKG, dizziness, vision problem and shortness of breath  Your physician has requested that you have an echocardiogram. Echocardiography is a painless test that uses sound waves to create images of your heart. It provides your doctor with information about the size and shape of your heart and how well your heart's chambers and valves are working. This procedure takes approximately one hour. There are no restrictions for this procedure.   Follow-Up: It was a pleasure seeing you in the office today. Please call us if you have new issues that need to be addressed before your next appt.  [phone]  Your physician wants you to follow-up in:  We will schedule a exercise stress test with VO2 max measurements with Dr. Nicholes Mangoan Bensimhon  CPX test to be done Scheduled  [facility]   Please wear comfortable clothing for treadmill.  No heavy meal but make sure to eat light.  Test takes about 1-2 hours.

## 2017-04-10 ENCOUNTER — Encounter: Payer: Self-pay | Admitting: Family Medicine

## 2017-04-10 ENCOUNTER — Ambulatory Visit (INDEPENDENT_AMBULATORY_CARE_PROVIDER_SITE_OTHER): Payer: BLUE CROSS/BLUE SHIELD | Admitting: Family Medicine

## 2017-04-10 ENCOUNTER — Telehealth: Payer: Self-pay | Admitting: Cardiovascular Disease

## 2017-04-10 DIAGNOSIS — L089 Local infection of the skin and subcutaneous tissue, unspecified: Secondary | ICD-10-CM

## 2017-04-10 MED ORDER — SULFAMETHOXAZOLE-TRIMETHOPRIM 800-160 MG PO TABS
1.0000 | ORAL_TABLET | Freq: Two times a day (BID) | ORAL | 0 refills | Status: AC
Start: 2017-04-10 — End: ?

## 2017-04-10 NOTE — Progress Notes (Signed)
Patient presents with symptoms of redness and discharge from a site on the patient's left lower leg. She states that she cut herself with a razor a few days ago. Denies fever or any other rash. Has been using Neosporin. Denies any history of MRSA in the past.  ROS: Negative except mentioned above.  GENERAL: NAD RESP: CTA B CARD: RRR SKIN: 2 in. area of erythema with a few pustules on the left lateral leg area, slight warmth, no significant tenderness, nv intact distally  NEURO: CN II-XII grossly intact   A/P: Left Lower Leg Skin Infection - Bactrim DS, keep area clean and dry, seek medical attention if symptoms persist or worsen. No athletic activity if febrile. Will discuss with trainer.

## 2017-04-10 NOTE — Telephone Encounter (Signed)
No answer.  Mailbox is full.

## 2017-04-10 NOTE — Telephone Encounter (Signed)
Left voicemail message on father's number per release form.

## 2017-04-10 NOTE — Telephone Encounter (Signed)
Patient needs to rs cpx per Cherene AltesElon Trainor please call patient

## 2017-04-11 ENCOUNTER — Ambulatory Visit (INDEPENDENT_AMBULATORY_CARE_PROVIDER_SITE_OTHER): Payer: BLUE CROSS/BLUE SHIELD

## 2017-04-11 ENCOUNTER — Encounter (HOSPITAL_COMMUNITY): Payer: BLUE CROSS/BLUE SHIELD

## 2017-04-11 ENCOUNTER — Other Ambulatory Visit: Payer: Self-pay

## 2017-04-11 DIAGNOSIS — R0602 Shortness of breath: Secondary | ICD-10-CM | POA: Diagnosis not present

## 2017-04-11 DIAGNOSIS — R42 Dizziness and giddiness: Secondary | ICD-10-CM

## 2017-04-11 LAB — ECHOCARDIOGRAM COMPLETE
Area-P 1/2: 4.78 cm2
CHL CUP TV REG PEAK VELOCITY: 206 cm/s
E decel time: 158 msec
EERAT: 4.8
FS: 52 % — AB (ref 28–44)
IVS/LV PW RATIO, ED: 1
LA diam end sys: 32 mm
LADIAMINDEX: 1.96 cm/m2
LASIZE: 32 mm
LAVOLA4C: 57.7 mL
LDCA: 3.14 cm2
LV E/e' medial: 4.8
LV E/e'average: 4.8
LV PW d: 9 mm — AB (ref 0.6–1.1)
LV TDI E'MEDIAL: 12
LV e' LATERAL: 20.7 cm/s
LVOT SV: 108 mL
LVOT VTI: 34.3 cm
LVOT diameter: 20 mm
MV Dec: 158
MV Peak grad: 4 mmHg
MV pk A vel: 30.6 m/s
MVPKEVEL: 99.4 m/s
MVSPHT: 46 ms
RV LATERAL S' VELOCITY: 17.7 cm/s
RV TAPSE: 28.9 mm
TDI e' lateral: 20.7
TRMAXVEL: 206 cm/s

## 2017-04-11 NOTE — Telephone Encounter (Signed)
Spoke with patient and provided her with phone number to call back and reschedule her test [phone]. She verbalized understanding with no further questions at this time.

## 2017-04-18 ENCOUNTER — Telehealth: Payer: Self-pay | Admitting: Cardiovascular Disease

## 2017-04-18 NOTE — Telephone Encounter (Signed)
Results are still in Dr. Windell HummingbirdGollan's inbasket awaiting review.  Will call her once he has given his interpretation/recommendation.

## 2017-04-18 NOTE — Telephone Encounter (Signed)
Pt would like echo results 

## 2017-05-01 ENCOUNTER — Ambulatory Visit (INDEPENDENT_AMBULATORY_CARE_PROVIDER_SITE_OTHER): Payer: BLUE CROSS/BLUE SHIELD | Admitting: Family Medicine

## 2017-05-01 ENCOUNTER — Encounter: Payer: Self-pay | Admitting: Family Medicine

## 2017-05-01 VITALS — BP 122/79 | HR 43 | Temp 97.7°F | Resp 14

## 2017-05-01 DIAGNOSIS — J069 Acute upper respiratory infection, unspecified: Secondary | ICD-10-CM

## 2017-05-01 MED ORDER — AMOXICILLIN-POT CLAVULANATE 875-125 MG PO TABS: 1.0000 | ORAL_TABLET | Freq: Two times a day (BID) | ORAL | 0 refills | Status: AC

## 2017-05-02 NOTE — Progress Notes (Signed)
Patient presents today with symptoms of nasal congestion (colored mucus), mild productive cough, decreased appetite. Patient states that she's had the symptoms for the last 5-6 days. She denies any fever, chest pain, shortness of breath, severe headache, abdominal pain, nausea, vomiting, diarrhea. She is afebrile in the office today and has not taken any medications today.  ROS: Negative except mentioned above.  GENERAL: NAD HEENT: mild pharyngeal erythema, no exudate, no erythema of TMs, no cervical LAD RESP: CTA B CARD: RRR NEURO: CN II-XII grossly intact   A/P: URI - will treat with Augmentin, over-the-counter cough suppressant and antihistamine, Tylenol/Ibuprofen when necessary, rest, hydration, seek medical attention if symptoms persist or worsen as discussed. No athletic activity or class if febrile.

## 2017-05-03 ENCOUNTER — Encounter (HOSPITAL_COMMUNITY): Payer: BLUE CROSS/BLUE SHIELD

## 2017-05-04 ENCOUNTER — Ambulatory Visit (INDEPENDENT_AMBULATORY_CARE_PROVIDER_SITE_OTHER): Payer: BLUE CROSS/BLUE SHIELD | Admitting: Family Medicine

## 2017-05-04 ENCOUNTER — Emergency Department
Admission: EM | Admit: 2017-05-04 | Discharge: 2017-05-04 | Disposition: A | Discharge status: Other Acute Inpatient Hospital | Payer: BLUE CROSS/BLUE SHIELD | Attending: Emergency Medicine | Admitting: Emergency Medicine

## 2017-05-04 ENCOUNTER — Emergency Department: Payer: BLUE CROSS/BLUE SHIELD

## 2017-05-04 ENCOUNTER — Other Ambulatory Visit: Payer: Self-pay

## 2017-05-04 ENCOUNTER — Encounter: Payer: Self-pay | Admitting: Family Medicine

## 2017-05-04 ENCOUNTER — Ambulatory Visit (HOSPITAL_COMMUNITY)
Admission: AD | Admit: 2017-05-04 | Discharge: 2017-05-04 | Disposition: A | Discharge status: Home / Self Care | Payer: BLUE CROSS/BLUE SHIELD | Source: Other Acute Inpatient Hospital | Attending: Emergency Medicine | Admitting: Emergency Medicine

## 2017-05-04 VITALS — BP 124/82 | HR 78 | Temp 98.7°F | Resp 14

## 2017-05-04 DIAGNOSIS — M7989 Other specified soft tissue disorders: Secondary | ICD-10-CM

## 2017-05-04 DIAGNOSIS — Z793 Long term (current) use of hormonal contraceptives: Secondary | ICD-10-CM | POA: Diagnosis not present

## 2017-05-04 DIAGNOSIS — I7300 Raynaud's syndrome without gangrene: Secondary | ICD-10-CM | POA: Diagnosis not present

## 2017-05-04 DIAGNOSIS — Z79899 Other long term (current) drug therapy: Secondary | ICD-10-CM | POA: Diagnosis not present

## 2017-05-04 DIAGNOSIS — I82409 Acute embolism and thrombosis of unspecified deep veins of unspecified lower extremity: Secondary | ICD-10-CM | POA: Insufficient documentation

## 2017-05-04 DIAGNOSIS — G540 Brachial plexus disorders: Secondary | ICD-10-CM | POA: Insufficient documentation

## 2017-05-04 DIAGNOSIS — I82622 Acute embolism and thrombosis of deep veins of left upper extremity: Secondary | ICD-10-CM | POA: Insufficient documentation

## 2017-05-04 DIAGNOSIS — M79602 Pain in left arm: Secondary | ICD-10-CM | POA: Diagnosis present

## 2017-05-04 HISTORY — DX: Raynaud's syndrome without gangrene: I73.00

## 2017-05-04 LAB — PROTIME-INR
INR: 0.97
Prothrombin Time: 12.8 seconds (ref 11.4–15.2)

## 2017-05-04 LAB — BASIC METABOLIC PANEL
Anion gap: 11 (ref 5–15)
BUN: 16 mg/dL (ref 6–20)
CHLORIDE: 100 mmol/L — AB (ref 101–111)
CO2: 26 mmol/L (ref 22–32)
CREATININE: 0.87 mg/dL (ref 0.44–1.00)
Calcium: 9.8 mg/dL (ref 8.9–10.3)
GFR calc non Af Amer: >60 mL/min (ref >60)
Glucose, Bld: 73 mg/dL (ref 65–99)
Potassium: 3.8 mmol/L (ref 3.5–5.1)
SODIUM: 137 mmol/L (ref 135–145)

## 2017-05-04 LAB — GLUCOSE, CAPILLARY: GLUCOSE-CAPILLARY: 89 mg/dL (ref 65–99)

## 2017-05-04 LAB — POCT PREGNANCY, URINE: PREG TEST UR: NEGATIVE

## 2017-05-04 LAB — APTT: APTT: 30 s (ref 24–36)

## 2017-05-04 MED ORDER — RIVAROXABAN (XARELTO) VTE STARTER PACK (15 & 20 MG): ORAL_TABLET | ORAL | 0 refills | Status: AC

## 2017-05-04 MED ORDER — HEPARIN BOLUS VIA INFUSION
3500.0000 [IU] | Freq: Once | INTRAVENOUS | Status: AC
  Administered 2017-05-04: 3500 [IU] via INTRAVENOUS
  Filled 2017-05-04: qty 3500

## 2017-05-04 MED ORDER — HEPARIN (PORCINE) IN NACL 100-0.45 UNIT/ML-% IJ SOLN
1000.0000 [IU]/h | INTRAMUSCULAR | Status: DC
  Administered 2017-05-04: 1000 [IU]/h via INTRAVENOUS
  Filled 2017-05-04: qty 250

## 2017-05-04 NOTE — ED Triage Notes (Signed)
Pt arrives to ER via POV. Pt c/o left arm swelling, feeling of heaviness, numbness and soreness that started yesterday. Pt hx of raynaud's. Pt bilateral hands reddened and cool at this time. Radial pulse to left arm is palpable, equal to right arm radial pulse. Left arm is significantly swollen from upper arm down to hand.

## 2017-05-04 NOTE — ED Notes (Signed)
[facility]  PAPER[facility]  AND  XRAY  COPY  SENT  WITH  CARELINK STAFF

## 2017-05-04 NOTE — ED Notes (Signed)
Pt in NAD at time of trasnfer, carelink at bedside, report has been given. VS stable. PT has signed transfer consent and verbalizes understanding

## 2017-05-04 NOTE — Discharge Instructions (Addendum)
You were found to have blood clot in the left arm (also called DVT = Deep Venous Thrombosis) and you are being started on a blood thinner called Xarelto - follow starter pack dosing.  Stop birth control as this may have contributed.  You do need to follow-up with a hematologist, blood specialist, to discuss any further testing for any genetic contribute factors.  You should follow-up with a gynecologist to discuss further options for birth control.

## 2017-05-04 NOTE — ED Notes (Signed)
Attempt X 1 for blood draw unsuccessful.

## 2017-05-04 NOTE — ED Notes (Signed)
EMTALA complete, VS within 30 minutes of transfer and consent signed.

## 2017-05-04 NOTE — Progress Notes (Signed)
ANTICOAGULATION CONSULT NOTE  Pharmacy Consult for Heparin Indication: DVT  No Known Allergies  Patient Measurements: Height: 5\' 5"  (165.1 cm) Weight: 127 lb (57.6 kg) IBW/kg (Calculated) : 57  Vital Signs: Temp: 98.7 F (37.1 C) (11/15 1239) Temp Source: Oral (11/15 1239) BP: 139/83 (11/15 1430) Pulse Rate: 100 (11/15 1430)  Labs: Recent Labs    05/04/17 1247  CREATININE 0.87    Estimated Creatinine Clearance: 92.8 mL/min (by C-G formula based on SCr of 0.87 mg/dL).   Medical History: Past Medical History:  Diagnosis Date  . Raynaud's disease   . Reynolds syndrome [facility](HCC)     Assessment: 21 y/o F admitted with LUE DVT.   Goal of Therapy:  Heparin level 0.3-0.7 units/ml Monitor platelets by anticoagulation protocol: Yes   Plan:  Give 3500 units bolus x 1 Start heparin infusion at 1000 units/hr Check anti-Xa level in 6 hours and daily while on heparin Continue to monitor H&H and platelets  Luisa HartChristy, Pace Lamadrid D 05/04/2017,5:04 PM

## 2017-05-04 NOTE — ED Notes (Signed)
Pt became diaphoretic and pale after blood draw. Given ice chips.

## 2017-05-04 NOTE — Progress Notes (Signed)
Patient presents today with symptoms of left arm swelling, pain and decreased extension. Patient states that she has not had any injury to her left arm. She has had the symptoms for the last 2 days. She is currently taking medication for a upper respiratory infection. She is taking Augmentin, Pseudoephedrine and Mucinex. She states her upper respiratory symptoms are improving. Patient does have a history of Raynaud's as well and her left hand she believes feels colder than her right. She denies any fever, chest pain, shortness of breath. She denies any shoulder or neck pain. She denies any history of DVT or PE in the past. Patient is right hand dominant. In Epic, it states that she is on OCPs.  ROS: Negative except mentioned above. Vitals as per Epic. GENERAL: NAD RESP: CTA B CARD: RRR  EXTREM: Left upper extremity - there is no neck or shoulder tenderness, no obvious ecchymosis, there is obvious swelling noted of the left upper extremity compared to the right upper extremity, there is mild tenderness in the biceps and forearm area, mildly decreased full extension and flexion, hands are cold and look a dark pink color bilaterally, radial pulse appreciated bilaterally NEURO: CN II-XII grossly intact   A/P: Left upper extremity swelling/pain/discoloration - would recommend ultrasound of the upper extremity to evaluate for DVT etc., I've asked the patient to go to the ER now for further evaluation and treatment, called the ER and spoke to the charge nurse, patient will have a friend with her at the ER, patient states that she will discuss above with her parents, inform the trainer about above. We'll discuss further evaluation and treatment once diagnosis is made.

## 2017-05-04 NOTE — ED Provider Notes (Signed)
[facility] Emergency Department Provider Note ____________________________________________   I have reviewed the triage vital signs and the triage nursing note.  HISTORY  Chief Complaint Arm Pain   Historian Patient  HPI Meredith English is a 21 y.o. female with Raynaud's presents with about 2 days of left arm pain and swelling also with exacerbation of left hand Raynaud's (cold).  Does take birth control (x 3 months), no family history of blood clots.  Shortness of breath.  No chest pain.  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  From [place].  Pain is mild to moderate.  Swelling is moderate.  No known trauma.   Past Medical History:  Diagnosis Date  . Raynaud's disease   . Reynolds syndrome [facility](HCC)     Patient Active Problem List   Diagnosis Date Noted  . Shortness of breath 04/06/2017  . Dizziness 04/06/2017  . Blurred vision 04/06/2017  . Raynaud's disease without gangrene 04/06/2017    Past Surgical History:  Procedure Laterality Date  . HIP SURGERY Right     Prior to Admission medications   Medication Sig Start Date End Date Taking? Authorizing Provider  amoxicillin-clavulanate (AUGMENTIN) 875-125 MG tablet Take 1 tablet 2 (two) times daily by mouth. 05/01/17   Jolene ProvostPatel, Kirtida, MD  Norgestim-Eth Charlott HollerEstrad Triphasic [facility](ORTHO TRI-CYCLEN, 28, PO) Take by mouth daily.    [provider]  Pseudoephedrine HCl (SINUS DECONGESTANT PO) Take by mouth.    [provider]  Rivaroxaban 15 & 20 MG TBPK Take as directed on package: Start with one 15mg  tablet by mouth twice a day with food. On Day 22, switch to one 20mg  tablet once a day with food. 05/04/17   Governor RooksLord, Hadja Harral, MD  sulfamethoxazole-trimethoprim (BACTRIM DS,SEPTRA DS) 800-160 MG tablet Take 1 tablet by mouth 2 (two) times daily. Patient not taking: Reported on 05/01/2017 04/10/17   Jolene ProvostPatel, Kirtida, MD    No Known Allergies  No family history on file.  Social History Social  History   Tobacco Use  . Smoking status: Never Smoker  . Smokeless tobacco: Never Used  Substance Use Topics  . Alcohol use: No  . Drug use: No    Review of Systems  Constitutional: Negative for fever. Eyes: Negative for visual changes. ENT: Negative for sore throat. Cardiovascular: Negative for chest pain. Respiratory: Negative for shortness of breath. Gastrointestinal: Negative for abdominal pain, vomiting and diarrhea. Genitourinary: Negative for dysuria. Musculoskeletal: Negative for back pain. Skin: Negative for rash. Neurological: Negative for headache.  ____________________________________________   PHYSICAL EXAM:  VITAL SIGNS: ED Triage Vitals  Enc Vitals Group     BP 05/04/17 1239 (!) 140/98     Pulse Rate 05/04/17 1239 78     Resp 05/04/17 1239 18     Temp 05/04/17 1239 98.7 F (37.1 C)     Temp Source 05/04/17 1239 Oral     SpO2 05/04/17 1239 97 %     Weight 05/04/17 1239 127 lb (57.6 kg)     Height 05/04/17 1239 5\' 5"  (1.651 m)     Head Circumference --      Peak Flow --      Pain Score 05/04/17 1238 6     Pain Loc --      Pain Edu? --      Excl. in GC? --      Constitutional: Alert and oriented. Well appearing and in no distress. HEENT   Head: Normocephalic and atraumatic.      Eyes: Conjunctivae are  normal. Pupils equal and round.       Ears:         Nose: No congestion/rhinnorhea.   Mouth/Throat: Mucous membranes are moist.   Neck: No stridor. Cardiovascular/Chest: Normal rate, regular rhythm.  No murmurs, rubs, or gallops. Respiratory: Normal respiratory effort without tachypnea nor retractions. Breath sounds are clear and equal bilaterally. No wheezes/rales/rhonchi. Gastrointestinal: Soft. No distention, no guarding, no rebound. Nontender.  Genitourinary/rectal:Deferred Musculoskeletal: Left upper extremity moderately swollen, no redness or skin rash.  Strong radial pulse.  Normal sensation.  She does have cool/cold left hand  fingertips without any blue discoloration.  No lower extremity swelling or calf pain.  Normal right upper extremity. Neurologic:  Normal speech and language. No gross or focal neurologic deficits are appreciated. Skin:  Skin is warm, dry and intact. No rash noted. Psychiatric: Mood and affect are normal. Speech and behavior are normal. Patient exhibits appropriate insight and judgment.   ____________________________________________  LABS (pertinent positives/negatives) I, Governor Rooks, MD the attending physician have reviewed the labs noted below.  Labs Reviewed  BASIC METABOLIC PANEL - Abnormal; Notable for the following components:      Result Value   Chloride 100 (*)    All other components within normal limits  GLUCOSE, CAPILLARY  APTT  PROTIME-INR  CBC  POC URINE PREG, ED  POCT PREGNANCY, URINE    ____________________________________________    EKG I, Governor Rooks, MD, the attending physician have personally viewed and interpreted all ECGs.  None ____________________________________________  RADIOLOGY All Xrays were viewed by me.  Imaging interpreted by Radiologist, and I, Governor Rooks, MD the attending physician have reviewed the radiologist interpretation noted below.  LUE [place]:  IMPRESSION: Positive for left upper extremity DVT in the subclavian, axillary and upper brachial veins. Axillary and upper brachial thrombus appears occlusive. Subclavian vein thrombus is occlusive distally and near occlusive proximally. __________________________________________  PROCEDURES  Procedure(s) performed: None  Critical Care performed: None   ____________________________________________  ED COURSE / ASSESSMENT AND PLAN  Pertinent labs & imaging results that were available during my care of the patient were reviewed by me and considered in my medical decision making (see chart for details).    US shows LUE occlusive dvt with occlusive symptoms - pain and swelling.   Good distal arterial blood flow, I think the cool hands are due to Raynauds.  Initially planned for outpatient management Xarelto, but I discussed with vascular surgeon here who feels that the DVT was more likely anatomic/thoracic outlet syndrome due to first rib compression rather than simply birth control risk for DVT given the location and recommends catheter directed thrombolysis followed by first rib resection which is quite specialized surgery, and referred me to discuss further with [facility] vascular surgeon Dr. Gayla Doss.  I spoke with [facility] vascular surgeon who agreed that this scenario seemed like it was very consistent with gaining benefit from intervention of thrombolysis and first rib resection rather than a long and drawn out possibly recurrent issue.  I took quite some time to face time in the room with both parents and the patient to discuss the options.  We did decide that transfer to [facility] tonight to discuss with Dr. Gayla Doss in the AM before likely procedure seems like the best decision moving forward.  I also updated patient's sports medicine physician Dr. Allena Katz by phone who was requesting update. I also spoke with patient's [place] based pediatrician by phone who was requesting update.  DIFFERENTIAL DIAGNOSIS: Including but not limited to cellulitis,  DVT, thoracic outlet syndrome, Raynauds, traumatic injury, allergic reaction, etc.  CONSULTATIONS:   Dr. Lorretta HarpSchneir, [facility] Vascular surgeon -recommends patient is likely a candidate for thrombolysis and first rib resection with specialist [facility].    [facility] with [facility] at [facility], requests transfer for admission to floor.  Recommends heparin - plans for thrombolysis in the AM. I spoke with Dr. Greta Doomyan Mehan with on call vascular awaiting floor bed availability.  Due to significant delay, vascular team requests ED to ED transfer to facilitate expeditious transfer.  I spoke with Dr. Debarah CrapeYen, ED who accepts in transfer.      Patient / Family / Caregiver informed of clinical course, medical decision-making process, and agree with plan.    ___________________________________________   FINAL CLINICAL IMPRESSION(S) / ED DIAGNOSES   Final diagnoses:  Acute deep vein thrombosis (DVT) of left upper extremity, unspecified vein (HCC)  Thoracic outlet syndrome      ___________________________________________  ED Discharge Orders        Ordered    Rivaroxaban 15 & 20 MG TBPK     05/04/17 1422            Note: This dictation was prepared with Dragon dictation. Any transcriptional errors that result from this process are unintentional    Governor RooksLord, Nadira Single, MD 05/04/17 [phone]

## 2017-05-04 NOTE — ED Notes (Signed)
CBG 89 

## 2017-05-16 ENCOUNTER — Encounter: Payer: Self-pay | Admitting: Family Medicine

## 2017-05-16 ENCOUNTER — Ambulatory Visit (INDEPENDENT_AMBULATORY_CARE_PROVIDER_SITE_OTHER): Payer: BLUE CROSS/BLUE SHIELD | Admitting: Family Medicine

## 2017-05-16 VITALS — BP 118/77 | HR 70 | Temp 98.4°F | Resp 14

## 2017-05-16 DIAGNOSIS — I82622 Acute embolism and thrombosis of deep veins of left upper extremity: Secondary | ICD-10-CM

## 2017-05-16 NOTE — Progress Notes (Signed)
Patient presents today for follow-up regarding left upper extremity DVT. Patient is currently on Lovenox and is planning to have surgical decompression on Friday of this week [facility]. Patient states that she has minimal soreness in the left upper extremity since her thrombolysis [facility]. She denies any chest pain or shortness of breath.  ROS: Negative except mentioned above. Vitals as per Epic. GENERAL: NAD EXTREM: Minimal swelling of left upper arm compared to right, significantly reduced since last visit, normal radial pulses, normal color of both upper extremities NEURO: CN II-XII grossly intact    A/P: Left upper extremity DVT - patient will continue Lovenox until Thursday, will have surgical decomp[facility] on Friday [facility], will return back to school on Sunday with her mother. Patient has been told that it is safe for her to travel after surgery. Patient will likely be on oral anticoagulant for 3-6 months. Patient will seek immediate medical attention if she has any problems this week before returning back home. Encouraged patient to bring back postop notes if possible. Would recommend that patient have hypercoagulable workup done after she is off the oral anticoagulant.

## 2017-05-19 DIAGNOSIS — G540 Brachial plexus disorders: Secondary | ICD-10-CM | POA: Insufficient documentation

## 2017-05-22 ENCOUNTER — Encounter (HOSPITAL_COMMUNITY): Payer: Self-pay | Admitting: *Deleted

## 2017-05-22 NOTE — Progress Notes (Signed)
Patient called to Cancel CPX today without a rescheduled appointment due to recent surgery, which appeared to be a surgical decompression of left upper extremity DVT. This appears to have [facility] this past Friday [facility]/30. CPX will not be rescheduled at this time, per patient's request.     Lesia HausenKristen Johnda Billiot, MS, ACSM-RCEP 05/22/2017 12:24 PM

## 2017-05-24 ENCOUNTER — Encounter (HOSPITAL_COMMUNITY): Payer: BLUE CROSS/BLUE SHIELD

## 2017-08-21 ENCOUNTER — Ambulatory Visit
Admission: RE | Admit: 2017-08-21 | Discharge: 2017-08-21 | Disposition: A | Discharge status: Home / Self Care | Payer: BLUE CROSS/BLUE SHIELD | Source: Ambulatory Visit | Attending: Family Medicine | Admitting: Family Medicine

## 2017-08-21 ENCOUNTER — Ambulatory Visit (INDEPENDENT_AMBULATORY_CARE_PROVIDER_SITE_OTHER): Payer: BLUE CROSS/BLUE SHIELD | Admitting: Family Medicine

## 2017-08-21 ENCOUNTER — Encounter: Payer: Self-pay | Admitting: Family Medicine

## 2017-08-21 VITALS — BP 115/74 | HR 71 | Temp 98.2°F | Resp 14

## 2017-08-21 DIAGNOSIS — R0789 Other chest pain: Secondary | ICD-10-CM | POA: Diagnosis present

## 2017-09-08 ENCOUNTER — Ambulatory Visit (INDEPENDENT_AMBULATORY_CARE_PROVIDER_SITE_OTHER): Payer: BLUE CROSS/BLUE SHIELD | Admitting: Family Medicine

## 2017-09-08 VITALS — BP 128/81 | HR 65 | Temp 96.7°F | Resp 14

## 2017-09-08 DIAGNOSIS — D508 Other iron deficiency anemias: Secondary | ICD-10-CM

## 2017-09-08 DIAGNOSIS — E559 Vitamin D deficiency, unspecified: Secondary | ICD-10-CM

## 2017-09-08 NOTE — Progress Notes (Signed)
Patient here for labs to check vitamin D level and ferritin/iron. Patient does not take any supplements at this time. Patient is a cross country runner. Denies any acute issues at this time.

## 2017-09-09 LAB — IRON AND TIBC
IRON: 109 ug/dL (ref 27–159)
Iron Saturation: 31 % (ref 15–55)
Total Iron Binding Capacity: 356 ug/dL (ref 250–450)
UIBC: 247 ug/dL (ref 131–425)

## 2017-09-09 LAB — FERRITIN: Ferritin: 28 ng/mL (ref 15–150)

## 2017-09-09 LAB — VITAMIN D 25 HYDROXY (VIT D DEFICIENCY, FRACTURES): VIT D 25 HYDROXY: 29.8 ng/mL — AB (ref 30.0–100.0)

## 2017-09-21 NOTE — Progress Notes (Signed)
Patient presents today with some discomfort along the left anterior chest wall. She states that she has noticed a 2 out of 10 discomfort in the area as she has started to progress her physical activity. She denies any shortness of breath but does state that when she takes a deep breath she feels mild pain in the area. This area was sore after her TOS surgery which she had at the end of last year. She denies any fever, chills, cough, headache, swelling of her upper extremities. Patient is on Lovenox still. She has been working with PT.  ROS: Negative except mentioned above. Vitals as per Epic. GENERAL: NAD RESP: CTA B CARD: RRR MSK: there is no erythema or obvious swelling along the surgical scars on the left torso area, there is mild tenderness to palpation in the left anterior proximal chest wall area, there is no noticeable swelling of her upper extremities or discoloration NEURO: CN II-XII grossly intact   A/P: Left anterior chest wall tenderness - discussed with patient that likely muscular in origin, possibly scar tissue related from her surgery, patient may need to progress her activity slower, will get chest x-ray, also advised patient to call her surgeon back home to discuss her symptoms, if any acute worsening of symptoms go to the ER as discussed for further evaluation/treatment.

## 2017-10-16 ENCOUNTER — Ambulatory Visit (INDEPENDENT_AMBULATORY_CARE_PROVIDER_SITE_OTHER): Payer: BLUE CROSS/BLUE SHIELD | Admitting: Family Medicine

## 2017-10-16 ENCOUNTER — Encounter: Payer: Self-pay | Admitting: Family Medicine

## 2017-10-16 VITALS — HR 48 | Resp 16

## 2017-10-16 DIAGNOSIS — R0602 Shortness of breath: Secondary | ICD-10-CM

## 2017-10-30 ENCOUNTER — Other Ambulatory Visit: Payer: Self-pay | Admitting: Family Medicine

## 2017-10-30 DIAGNOSIS — R069 Unspecified abnormalities of breathing: Secondary | ICD-10-CM

## 2017-11-03 ENCOUNTER — Ambulatory Visit (INDEPENDENT_AMBULATORY_CARE_PROVIDER_SITE_OTHER): Payer: BLUE CROSS/BLUE SHIELD | Admitting: Internal Medicine

## 2017-11-03 ENCOUNTER — Encounter: Payer: Self-pay | Admitting: Internal Medicine

## 2017-11-03 VITALS — BP 124/82 | HR 61 | Resp 16 | Ht 65.0 in | Wt 129.0 lb

## 2017-11-03 DIAGNOSIS — J4520 Mild intermittent asthma, uncomplicated: Secondary | ICD-10-CM

## 2017-11-03 DIAGNOSIS — R059 Cough, unspecified: Secondary | ICD-10-CM

## 2017-11-03 DIAGNOSIS — R05 Cough: Secondary | ICD-10-CM

## 2017-11-03 MED ORDER — ALBUTEROL SULFATE HFA 108 (90 BASE) MCG/ACT IN AERS: 2.0000 | INHALATION_SPRAY | Freq: Four times a day (QID) | RESPIRATORY_TRACT | 2 refills | Status: AC | PRN

## 2017-11-03 MED ORDER — BUDESONIDE-FORMOTEROL FUMARATE 160-4.5 MCG/ACT IN AERO: 2.0000 | INHALATION_SPRAY | Freq: Two times a day (BID) | RESPIRATORY_TRACT | 12 refills | Status: AC

## 2017-11-03 NOTE — Patient Instructions (Addendum)
START SYMBICORT INHALER THERAPY-RINSE MOUTH AFTER EVERY USE ALBUTEROL INHALER THERAPY 2 PUFFS PRIOR TO EXERCISE AND 2-4 PUFFS EVERY 4 HRS AS NEEDED  AVOID ALLERGENS

## 2017-11-03 NOTE — Progress Notes (Signed)
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 S/p Thrombolysis at [facility] s/p DOAC THERAPY and s/p left RIB resection The DVT was provoked  Since patient had surgery of left rib resection in 04/2017, she has had difficulty breathing with exertion Intermittent cough and wheezing She has no signs  Of infection No sign of ext swelling No signs of CHF She does have intermittent cough throughout the week  She states that her symptoms occur mainly with exercise She has never had these symptoms before Her GM has h/o ASTHMA  She does not smoke She is a very athletic cross country runner She is in her senior year of college She states that she has no triggers and is NOT allergic to anything at this time  I have explained to her and her father about the spirometry findings of moderate obstructive airways disease      PAST MEDICAL HISTORY :   has a past medical history of Raynaud's disease and Reynolds syndrome (HCC).  has a past surgical history that includes Hip surgery (Right). Prior to Admission medications   Medication Sig Start Date End Date Taking? Authorizing Provider  amoxicillin-clavulanate (AUGMENTIN) 875-125 MG tablet Take 1 tablet 2 (two) times daily by mouth. 05/01/17   Jolene Provost, MD  Norgestim-Eth Charlott Holler Triphasic [facility] TRI-CYCLEN, 28, PO) Take by mouth daily.    [provider]  Pseudoephedrine HCl (SINUS DECONGESTANT PO) Take by mouth.    [provider]  Rivaroxaban 15 & 20 MG TBPK Take as directed on package: Start  with one  tablet by mouth twice a day with food. On Day 22, switch to one  tablet once a day with food. 05/04/17   Governor Rooks, MD  sulfamethoxazole-trimethoprim (BACTRIM DS,SEPTRA DS) 800-160 MG tablet Take 1 tablet by mouth 2 (two) times daily. Patient not taking: Reported on 05/01/2017 04/10/17   Jolene Provost, MD   No Known Allergies  FAMILY HISTORY:  GM +ASTHMA  SOCIAL HISTORY:  reports that she has never smoked. She has never used smokeless tobacco. She reports that she does not drink alcohol or use drugs.  REVIEW OF SYSTEMS:   Constitutional: Negative for fever, chills, weight loss, malaise/fatigue and diaphoresis.  HENT: Negative for hearing loss, ear pain, nosebleeds, congestion, sore throat, neck pain, tinnitus and ear discharge.   Eyes: Negative for blurred vision, double vision, photophobia, pain, discharge and redness.  Respiratory: +cough, -hemoptysis, -sputum production, +shortness of breath, +wheezing and -stridor.   Cardiovascular: Negative for chest pain, palpitations, orthopnea, claudication, leg swelling and PND.  Gastrointestinal: Negative for heartburn, nausea, vomiting, abdominal pain, diarrhea, constipation, blood in stool and melena.  Genitourinary: Negative for dysuria, urgency, frequency, hematuria and flank pain.  Musculoskeletal: Negative for myalgias, back pain, joint pain and falls.  Skin: Negative for itching and rash.  Neurological: Negative for dizziness, tingling, tremors, sensory change, speech change, focal weakness, seizures, loss of consciousness, weakness and headaches.  Endo/Heme/Allergies: Negative for environmental allergies and polydipsia. Does not bruise/bleed easily.  ALL OTHER ROS ARE NEGATIVE  BP 124/82 (BP Location: Left Arm, Cuff Size: Normal)  Pulse 61   Resp 16   Ht  (1.651 m)   Wt 129 lb (58.5 kg)   SpO2 99%   BMI 21.47 kg/m    Physical Examination:   GENERAL:NAD, no fevers, chills, no weakness no  fatigue HEAD: Normocephalic, atraumatic.  EYES: Pupils equal, round, reactive to light. Extraocular muscles intact. No scleral icterus.  MOUTH: Moist mucosal membrane.   EAR, NOSE, THROAT: Clear without exudates. No external lesions.  NECK: Supple. No thyromegaly. No nodules. No JVD.  PULMONARY:CTA B/L no wheezes, no crackles, no rhonchi CARDIOVASCULAR: S1 and S2. Regular rate and rhythm. No murmurs, rubs, or gallops. No edema.  GASTROINTESTINAL: Soft, nontender, nondistended. No masses. Positive bowel sounds.  MUSCULOSKELETAL: No swelling, clubbing, or edema. Range of motion full in all extremities.  NEUROLOGIC: Cranial nerves II through XII are intact. No gross focal neurological deficits.  SKIN: No ulceration, lesions, rashes, or cyanosis. Skin warm and dry. Turgor intact.  PSYCHIATRIC: Mood, affect within normal limits. The patient is awake, alert and oriented x 3. Insight, judgment intact.      ASSESSMENT / PLAN: 22 yo pleasant WF with h/o Left ARM DVT s/p LEFT RIB RESECTION now with intermittent SOB with exertion with cough and wheezing with exercise  Based on Office spirometry, patient has moderate Obstructive airways disease which can be related to underlying reactive airways disease and ASTHMA  I recommend starting Symbicort Therapy and ALbuterol as needed and repeat Office Spirometry with Pulmonary in [place] to assess interval changes  Patient will need close follow up however, patient is traveling back to [place] for the summer I dicussed findings with Father via phone as permitted by  Patient  If symptoms do not improve with inhaler therapy, I will then proceed with CT chest to assess lung fields and consider stress ECHO   I have advised that patient is cleared for activity as tolerated  Patient/Family are very satisfied with Plan of action and management. All questions answered Follow up prior to start of school year   Sharniece Gibbon Santiago Glad, M.D.  [facility]  Medical Director [facility] Medical Director [facility] Cardio-Pulmonary Department

## 2017-11-23 ENCOUNTER — Encounter: Payer: Self-pay | Admitting: Internal Medicine

## 2017-12-27 NOTE — Progress Notes (Signed)
Patient presents today for follow-up regarding her anterior chest wall pain. Patient states that she no longer has focal pain but feels restricted with breathing when she is exercising. She admits to a gradual increase of activity with PT, trainer and coaches help. She denies any chest pain, palpitations, shortness of breath at rest, headache, fever, extremity swelling. Patient had a history of TOS and first rib resection at the end of last year. She is off her anticoagulation medication now. Her surgeon back home in [place] does not feel that her current symptoms are related to her TOS. I do not believe she was diagnosed with a PE during that time. She is comfortably sitting in the office today without any difficulty breathing. She states her symptoms only really occur when she is running. She denies any history of smoking or asthma. She denies any seasonal allergies.  ROS: Negative except mentioned above. Vitals as per Epic. GENERAL: NAD HEENT: no pharyngeal erythema, no exudate, no erythema of TMs, no cervical LAD RESP: CTA B, no tenderness to palpation along chest wall, no accessory muscle use noted CARD: RRR EXTREM: no significant swelling or tenderness of upper or lower extremities NEURO: CN II-XII grossly intact   A/P: Shortness of breath with exercise - unsure as to the etiology, unsure whether her surgery would have changed any muscular anatomy contributing to her symptoms, there is a part of this that could be related to deconditioning, would recommend patient see Pulmonology to further evaluate and treat, may need PFTs or imaging, if any acute worsening symptoms patient is to seek immediate medical attention as discussed. Discussed above with trainer.

## 2018-02-13 ENCOUNTER — Encounter: Payer: Self-pay | Admitting: Family Medicine

## 2018-02-13 ENCOUNTER — Ambulatory Visit (INDEPENDENT_AMBULATORY_CARE_PROVIDER_SITE_OTHER): Payer: PRIVATE HEALTH INSURANCE | Admitting: Family Medicine

## 2018-02-13 VITALS — BP 106/66 | HR 62 | Temp 97.6°F | Resp 14

## 2018-02-13 DIAGNOSIS — R0602 Shortness of breath: Secondary | ICD-10-CM | POA: Diagnosis not present

## 2018-02-15 ENCOUNTER — Ambulatory Visit: Payer: BLUE CROSS/BLUE SHIELD

## 2018-02-15 ENCOUNTER — Other Ambulatory Visit: Payer: Self-pay | Admitting: Family Medicine

## 2018-02-15 ENCOUNTER — Ambulatory Visit
Admission: RE | Admit: 2018-02-15 | Discharge: 2018-02-15 | Disposition: A | Discharge status: Home / Self Care | Payer: BLUE CROSS/BLUE SHIELD | Source: Ambulatory Visit | Attending: Family Medicine | Admitting: Family Medicine

## 2018-02-15 DIAGNOSIS — Z86711 Personal history of pulmonary embolism: Secondary | ICD-10-CM | POA: Diagnosis present

## 2018-02-15 HISTORY — DX: Unspecified asthma, uncomplicated: J45.909

## 2018-02-15 MED ORDER — IOPAMIDOL (ISOVUE-370) INJECTION 76%
75.0000 mL | Freq: Once | INTRAVENOUS | Status: AC | PRN
  Administered 2018-02-15: 75 mL via INTRAVENOUS

## 2018-02-21 ENCOUNTER — Ambulatory Visit: Payer: BLUE CROSS/BLUE SHIELD

## 2018-03-22 ENCOUNTER — Ambulatory Visit (INDEPENDENT_AMBULATORY_CARE_PROVIDER_SITE_OTHER): Payer: BLUE CROSS/BLUE SHIELD | Admitting: Family Medicine

## 2018-03-22 ENCOUNTER — Encounter: Payer: Self-pay | Admitting: Family Medicine

## 2018-03-22 VITALS — BP 111/76 | HR 70 | Temp 97.8°F | Resp 14

## 2018-03-22 DIAGNOSIS — J069 Acute upper respiratory infection, unspecified: Secondary | ICD-10-CM

## 2018-03-22 NOTE — Progress Notes (Signed)
Patient presents today with symptoms of nasal drainage, cough for the last 2 days.  She denies any fever, chest pain, sinus pressure or shortness of breath.  She tried taking Benadryl and decongestant 2 days ago.  She believes her symptoms have gotten slightly better since when they started 2 days ago.  Patient is taking Xarelto and awaiting repeat blood tests during Thanksgiving to see if she will need to take it lifelong.  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  She has gotten clearance from her Hematologist/Oncologist at home to be able to continue cross country activities.  She is not doing any contact sport activities.  ROS: Negative except mentioned above. Vitals as per Epic. GENERAL: NAD HEENT: mild pharyngeal erythema, no exudate, no erythema of TMs, no cervical LAD RESP: CTA B CARD: RRR NEURO: CN II-XII grossly intact   A/P: URI -discussed with patient likely viral etiology, can try taking oral antihistamine such as Claritin for postnasal drip and Flonase for nasal congestion, can also try over-the-counter cough suppressant (clear with pharmacist before using to make sure there is no interaction with Xarelto), Tylenol as needed, rest, hydration, no class or athletic activity if febrile, seek medical attention if symptoms persist or worsen as discussed.

## 2018-07-16 IMAGING — US US EXTREM  UP VENOUS*L*
1 series · 13 of 24 positions shown · non-contrast
Comparison: None.

CLINICAL DATA: Swelling of left upper extremity.



[Series 1: us extrem up venous*left* · 0.07mm/px · 13 of 42 slices shown]
[im 1/42]
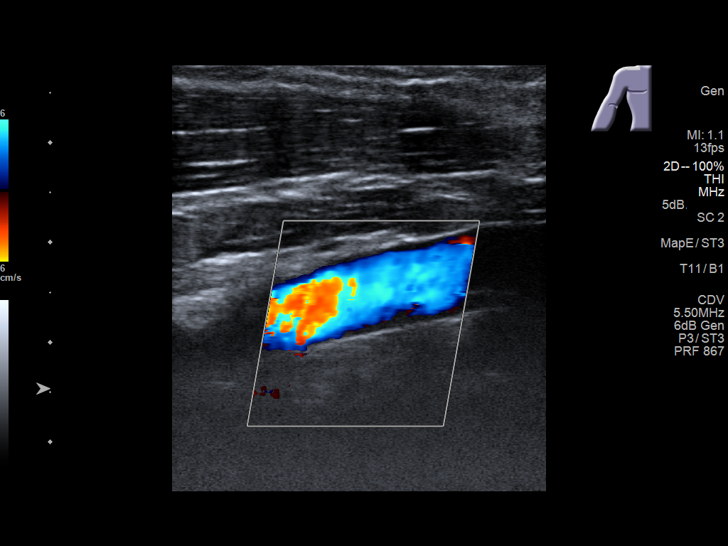
[im 4/42]
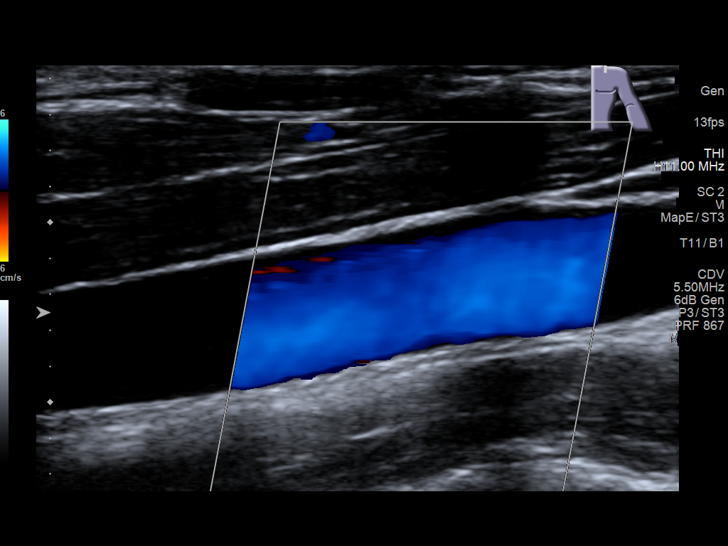
[im 8/42]
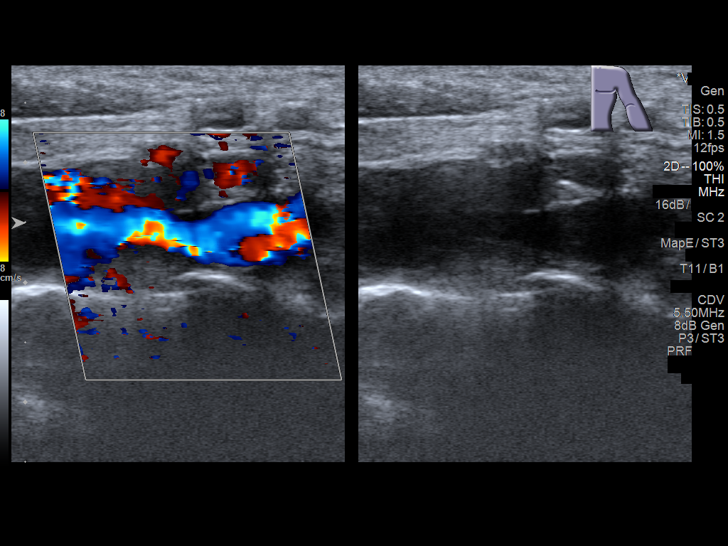
[im 11/42]
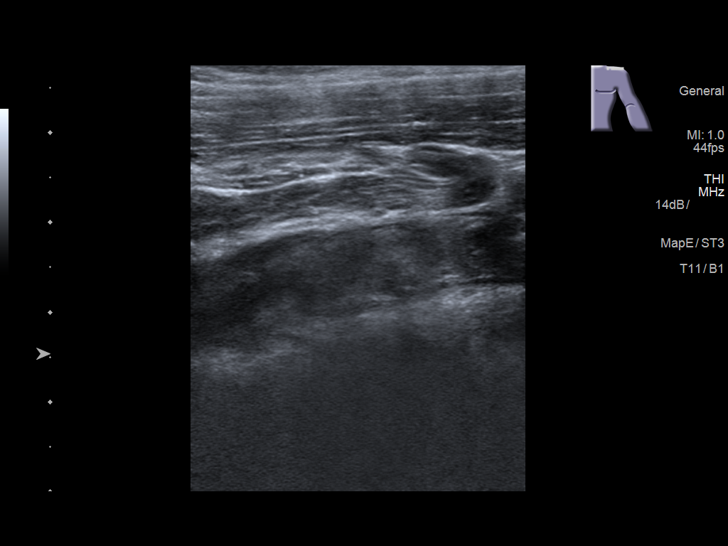
[im 15/42]
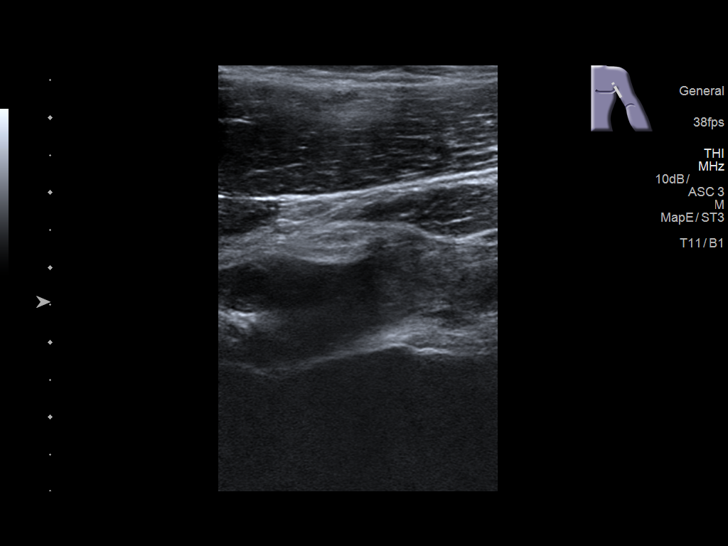
[im 18/42]
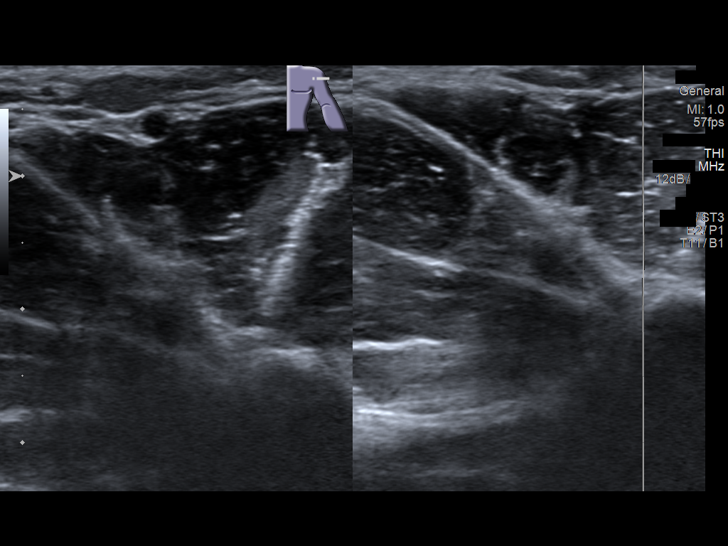
[im 22/42]
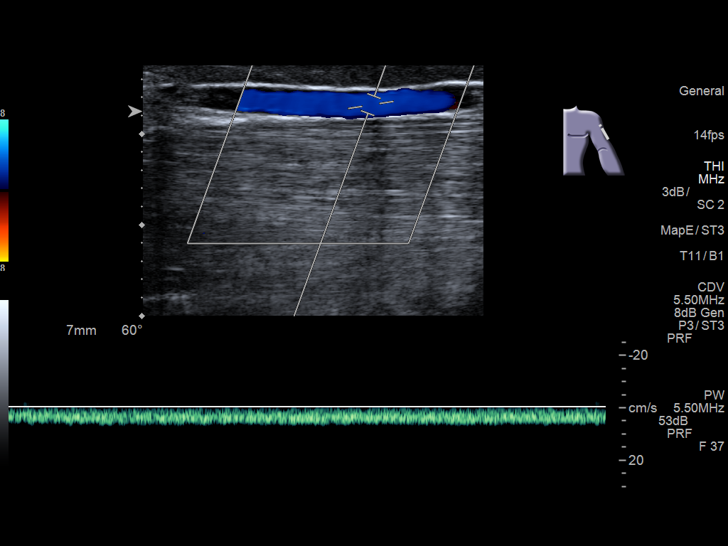
[im 24/42]
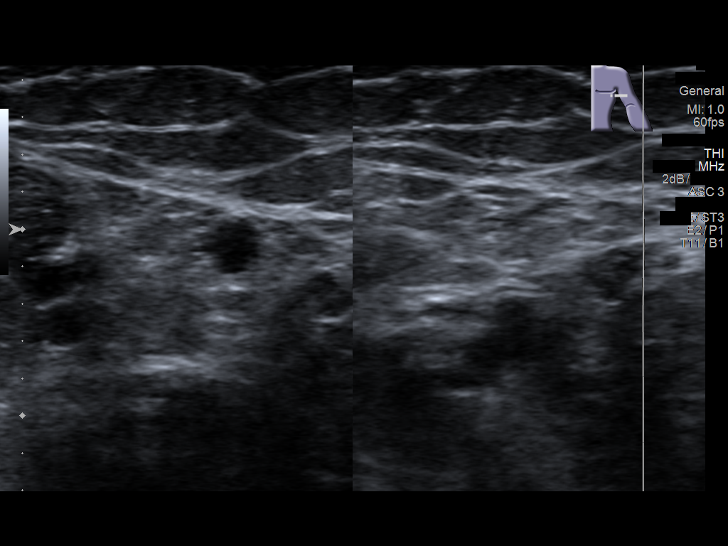
[im 27/42]
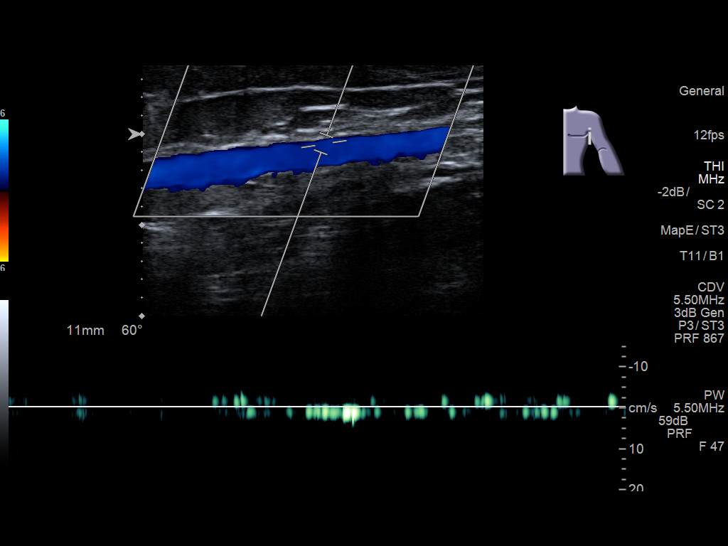
[im 31/42]
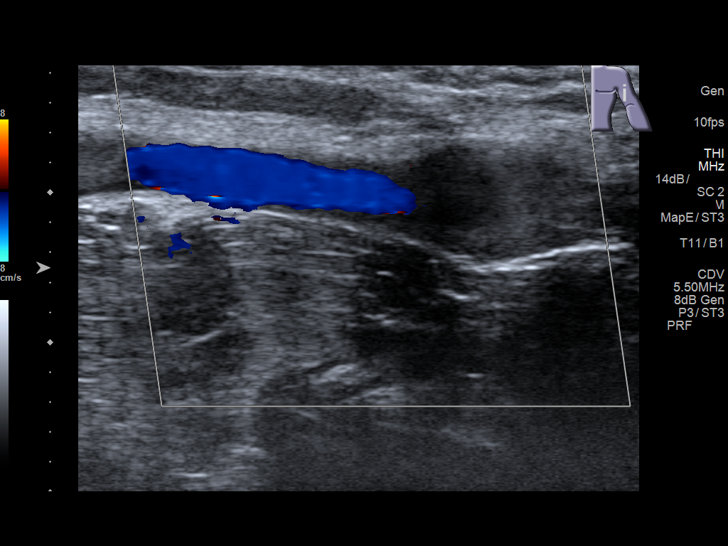
[im 34/42]
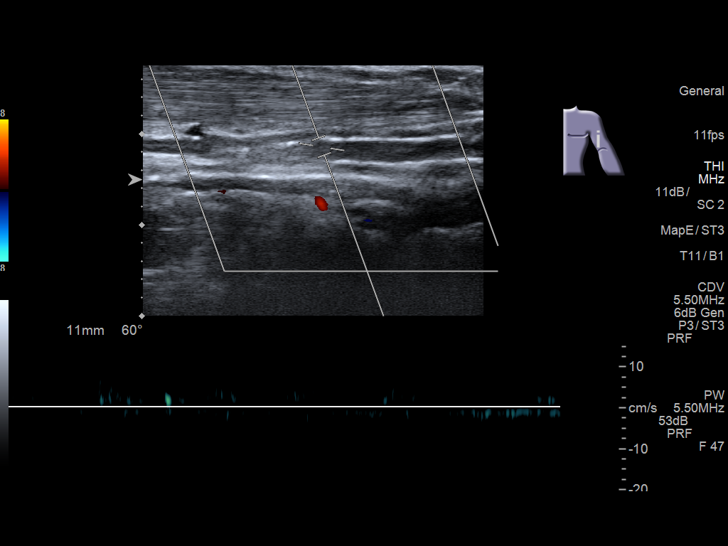
[im 38/42]
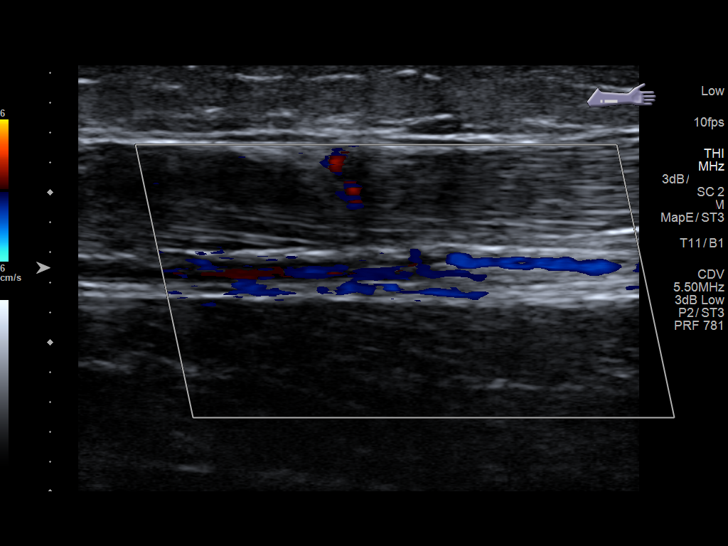
[im 42/42]
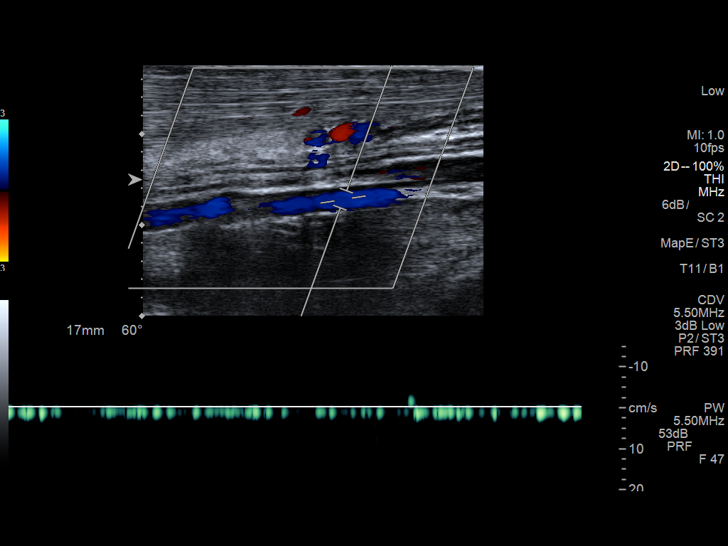

[13 of 24 positions shown; findings below may reference images not displayed]

FINDINGS: Contralateral Subclavian Vein: Respiratory phasicity is normal and
symmetric with the symptomatic side. No evidence of thrombus. Normal
compressibility.

Internal Jugular Vein: No evidence of thrombus. Normal
compressibility, respiratory phasicity and response to augmentation.

Subclavian Vein: Occlusive distal subclavian thrombus and near
occlusive proximal subclavian thrombus.

Axillary Vein: Occlusive thrombus present.

Cephalic Vein: No evidence of thrombus. Normal compressibility,
respiratory phasicity and response to augmentation.

Basilic Vein: No evidence of thrombus. Normal compressibility,
respiratory phasicity and response to augmentation.

Brachial Veins: Occlusive thrombus present in the upper arm. More
distal brachial segments are patent.

Radial Veins: No evidence of thrombus. Normal compressibility,
respiratory phasicity and response to augmentation.

Ulnar Veins: No evidence of thrombus. Normal compressibility,
respiratory phasicity and response to augmentation.

Venous Reflux:  None visualized.

Other Findings:  None visualized.
IMPRESSION: Positive for left upper extremity DVT in the subclavian, axillary
and upper brachial veins. Axillary and upper brachial thrombus
appears occlusive. Subclavian vein thrombus is occlusive distally
and near occlusive proximally.

## 2018-08-15 ENCOUNTER — Ambulatory Visit
Admission: RE | Admit: 2018-08-15 | Discharge: 2018-08-15 | Disposition: A | Discharge status: Home / Self Care | Payer: BLUE CROSS/BLUE SHIELD | Source: Ambulatory Visit | Attending: Family Medicine | Admitting: Family Medicine

## 2018-08-15 ENCOUNTER — Ambulatory Visit
Admission: RE | Admit: 2018-08-15 | Discharge: 2018-08-15 | Disposition: A | Discharge status: Home / Self Care | Payer: BLUE CROSS/BLUE SHIELD | Attending: Family Medicine | Admitting: Family Medicine

## 2018-08-15 ENCOUNTER — Other Ambulatory Visit: Payer: Self-pay | Admitting: Family Medicine

## 2018-08-15 DIAGNOSIS — R52 Pain, unspecified: Secondary | ICD-10-CM | POA: Insufficient documentation

## 2018-08-15 DIAGNOSIS — R609 Edema, unspecified: Secondary | ICD-10-CM | POA: Insufficient documentation

## 2018-11-24 NOTE — Progress Notes (Signed)
Patient here for follow-up regarding intermittent SOB and anterior left chest discomfort. She states her symptoms have improved since her last visit. Patient had first rib resection due to TOS/LUE DVT. Currently is on Xarelto.  Denies any bleeding. Is going to have repeat lab work in a few months for hypercoag. work-up. Denies any CP, fever, SOB at this time.   ROS: Negative except mentioned above. Vitals as per Epic.  GENERAL: NAD HEENT: no pharyngeal erythema, no exudate RESP: CTA B, minimal tenderness to anterior left chest area on deep palpation CARD: RRR NEURO: CN II-XII grossly intact   A/P: Intermittent SOB, Chest Discomfort - will do CT Chest to evaluate further, possibly scar tissue causing symptoms, on Xarelto so less likely PE, will f/u after CT is done and reviewed. If negative CT follow-up with surgeon if possible.
